# Patient Record
Sex: Male | Born: 1995 | Race: White | Hispanic: No | Marital: Single | Smoking: Never smoker
Health system: Southern US, Community
[De-identification: ages and names within clinical notes are randomized; demographics above are authoritative.]

## PROBLEM LIST (undated history)

## (undated) DIAGNOSIS — J45909 Unspecified asthma, uncomplicated: Secondary | ICD-10-CM

## (undated) HISTORY — DX: Unspecified asthma, uncomplicated: J45.909

---
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
## 2015-09-03 DIAGNOSIS — J3081 Allergic rhinitis due to animal (cat) (dog) hair and dander: Secondary | ICD-10-CM | POA: Diagnosis not present

## 2015-09-03 DIAGNOSIS — J301 Allergic rhinitis due to pollen: Secondary | ICD-10-CM | POA: Diagnosis not present

## 2015-09-12 DIAGNOSIS — J301 Allergic rhinitis due to pollen: Secondary | ICD-10-CM | POA: Diagnosis not present

## 2015-09-19 DIAGNOSIS — J301 Allergic rhinitis due to pollen: Secondary | ICD-10-CM | POA: Diagnosis not present

## 2015-09-19 DIAGNOSIS — J3081 Allergic rhinitis due to animal (cat) (dog) hair and dander: Secondary | ICD-10-CM | POA: Diagnosis not present

## 2015-09-24 DIAGNOSIS — J3081 Allergic rhinitis due to animal (cat) (dog) hair and dander: Secondary | ICD-10-CM | POA: Diagnosis not present

## 2015-10-18 DIAGNOSIS — R51 Headache: Secondary | ICD-10-CM | POA: Diagnosis not present

## 2015-10-18 DIAGNOSIS — J029 Acute pharyngitis, unspecified: Secondary | ICD-10-CM | POA: Diagnosis not present

## 2015-10-24 DIAGNOSIS — J301 Allergic rhinitis due to pollen: Secondary | ICD-10-CM | POA: Diagnosis not present

## 2015-10-24 DIAGNOSIS — J3081 Allergic rhinitis due to animal (cat) (dog) hair and dander: Secondary | ICD-10-CM | POA: Diagnosis not present

## 2015-10-30 DIAGNOSIS — J301 Allergic rhinitis due to pollen: Secondary | ICD-10-CM | POA: Diagnosis not present

## 2015-10-30 DIAGNOSIS — J3081 Allergic rhinitis due to animal (cat) (dog) hair and dander: Secondary | ICD-10-CM | POA: Diagnosis not present

## 2015-11-02 DIAGNOSIS — J3081 Allergic rhinitis due to animal (cat) (dog) hair and dander: Secondary | ICD-10-CM | POA: Diagnosis not present

## 2015-11-02 DIAGNOSIS — J301 Allergic rhinitis due to pollen: Secondary | ICD-10-CM | POA: Diagnosis not present

## 2015-11-02 DIAGNOSIS — L209 Atopic dermatitis, unspecified: Secondary | ICD-10-CM | POA: Diagnosis not present

## 2015-11-08 DIAGNOSIS — J301 Allergic rhinitis due to pollen: Secondary | ICD-10-CM | POA: Diagnosis not present

## 2015-11-08 DIAGNOSIS — J3081 Allergic rhinitis due to animal (cat) (dog) hair and dander: Secondary | ICD-10-CM | POA: Diagnosis not present

## 2015-11-23 DIAGNOSIS — G43009 Migraine without aura, not intractable, without status migrainosus: Secondary | ICD-10-CM | POA: Diagnosis not present

## 2015-11-28 DIAGNOSIS — J3081 Allergic rhinitis due to animal (cat) (dog) hair and dander: Secondary | ICD-10-CM | POA: Diagnosis not present

## 2015-11-28 DIAGNOSIS — J301 Allergic rhinitis due to pollen: Secondary | ICD-10-CM | POA: Diagnosis not present

## 2015-12-12 DIAGNOSIS — J301 Allergic rhinitis due to pollen: Secondary | ICD-10-CM | POA: Diagnosis not present

## 2015-12-12 DIAGNOSIS — J3081 Allergic rhinitis due to animal (cat) (dog) hair and dander: Secondary | ICD-10-CM | POA: Diagnosis not present

## 2016-04-10 DIAGNOSIS — Z23 Encounter for immunization: Secondary | ICD-10-CM | POA: Diagnosis not present

## 2016-04-25 ENCOUNTER — Emergency Department (HOSPITAL_COMMUNITY)
Admission: EM | Admit: 2016-04-25 | Discharge: 2016-04-26 | Disposition: A | Discharge status: Home / Self Care | Payer: BLUE CROSS/BLUE SHIELD | Attending: Emergency Medicine | Admitting: Emergency Medicine

## 2016-04-25 ENCOUNTER — Encounter (HOSPITAL_COMMUNITY): Payer: Self-pay | Admitting: Emergency Medicine

## 2016-04-25 DIAGNOSIS — T7840XA Allergy, unspecified, initial encounter: Secondary | ICD-10-CM

## 2016-04-25 NOTE — ED Triage Notes (Addendum)
Around 9 patient started itching but did not think about it and went to bed. Patient woke up with wheezing, dizziness, and severe itching in hands. Patient started to feel like hands where swelling. Patient took one benadryl and then another twenty minutes later. The only symptoms patient has now is reddened palms.

## 2016-04-26 MED ORDER — FAMOTIDINE 20 MG PO TABS
20.0000 mg | ORAL_TABLET | Freq: Once | ORAL | Status: AC
  Administered 2016-04-26: 20 mg via ORAL
  Filled 2016-04-26: qty 1

## 2016-04-26 MED ORDER — EPINEPHRINE 0.3 MG/0.3ML IJ SOAJ: 0.3000 mg | Freq: Once | INTRAMUSCULAR | 0 refills | Status: AC

## 2016-04-26 MED ORDER — DEXAMETHASONE SODIUM PHOSPHATE 10 MG/ML IJ SOLN
10.0000 mg | Freq: Once | INTRAMUSCULAR | Status: AC
  Administered 2016-04-26: 10 mg via INTRAMUSCULAR

## 2016-04-26 MED ORDER — DEXAMETHASONE SODIUM PHOSPHATE 10 MG/ML IJ SOLN
10.0000 mg | Freq: Once | INTRAMUSCULAR | Status: DC
  Filled 2016-04-26: qty 1

## 2016-04-26 NOTE — Discharge Instructions (Signed)
Please continue taking the Benadryl as needed. I'm also giving a prescription for an EpiPen to use if you feel like your throat is closing up and you are having difficulty breathing. Please see your primary care doctor this week for follow-up. Return to the ED if he develops worsening symptoms or if he had to use her EpiPen.

## 2016-04-26 NOTE — ED Provider Notes (Signed)
[facility]-EMERGENCY DEPT Provider Note   CSN: [id number] Arrival date & time: 04/25/16  2301     History   Chief Complaint Chief Complaint  Patient presents with  . Allergic Reaction    HPI Michael Norman is a 20 y.o. male.  20 year old Caucasian male with a past medical history significant for allergies, exzema and migraines presents to the ED today with a possible allergic reaction. Patient states around 9:00 this evening he started itching all over. He didn't think anything about it went to bed. Patient states that he woke up with severe itching and feeling like he couldn't breathe. Patient states like his hands were swollen. Patient denies any rash. He took 2 Benadryl and his symptoms have since resolved. Patient states that he ate a bunch of peanuts pecans this evening however he does not have any anaphylaxis reaction to peanuts. States he is treated by the allergist for several allergies none of which are food allergies. Patient does not have an epi pen. He denies any complaints at this time.      History reviewed. No pertinent past medical history.  Patient Active Problem List   Diagnosis Date Noted  . Migraine without aura, without mention of intractable migraine without mention of status migrainosus 04/22/2013  . Episodic tension type headache 04/22/2013    Past Surgical History:  Procedure Laterality Date  . WISDOM TOOTH EXTRACTION  Aug 2015       Home Medications    Prior to Admission medications   Medication Sig Start Date End Date Taking? Authorizing Provider  doxycycline (VIBRA-TABS) 100 MG tablet  02/16/14   Historical Provider, MD  eletriptan (RELPAX) 40 MG tablet Take 1 tablet at onset of migraine along with Ibuoprofen or Aleve 02/17/14   Elveria Rising, NP  loratadine (CLARITIN) 10 MG tablet Take 10 mg by mouth daily.    Historical Provider, MD  ondansetron (ZOFRAN-ODT) 4 MG disintegrating tablet Place 1 tablet under the tongue at the onset of nausea     Historical Provider, MD  ranitidine (ZANTAC) 150 MG tablet Take 150 mg by mouth 2 (two) times daily. Takes 1 tablet daily    Historical Provider, MD  SF 5000 PLUS 1.1 % CREA dental cream  01/19/14   Historical Provider, MD    Family History History reviewed. No pertinent family history.  Social History Social History  Substance Use Topics  . Smoking status: Never Smoker  . Smokeless tobacco: Never Used  . Alcohol use No     Allergies   Zyrtec [cetirizine]   Review of Systems Review of Systems  Constitutional: Negative for chills and fever.  HENT: Negative for congestion, ear pain, rhinorrhea and sore throat.   Eyes: Negative for pain and discharge.  Respiratory: Negative for cough, shortness of breath, wheezing and stridor.   Cardiovascular: Negative for chest pain and palpitations.  Gastrointestinal: Negative for abdominal pain, diarrhea, nausea and vomiting.  Genitourinary: Negative for flank pain, frequency, hematuria and urgency.  Musculoskeletal: Negative for myalgias and neck pain.  Neurological: Negative for dizziness, syncope, weakness, light-headedness, numbness and headaches.  All other systems reviewed and are negative.    Physical Exam Updated Vital Signs BP 130/76 (BP Location: Left Arm)   Pulse 70   Temp 97.9 F (36.6 C) (Oral)   Resp 20   Ht 6\' 1"  (1.854 m)   Wt 86.2 kg   SpO2 100%   BMI 25.07 kg/m   Physical Exam  Constitutional: He is oriented to person, place, and  time. He appears well-developed and well-nourished. No distress.  Patient is talking in complete sentences and managing his secretions and maintaining his airway.  HENT:  Head: Normocephalic and atraumatic.  Right Ear: Tympanic membrane, external ear and ear canal normal.  Left Ear: Tympanic membrane, external ear and ear canal normal.  Nose: Nose normal.  Mouth/Throat: Uvula is midline, oropharynx is clear and moist and mucous membranes are normal.  Eyes: Pupils are equal, round,  and reactive to light. Right eye exhibits no discharge. Left eye exhibits no discharge. No scleral icterus.  Neck: Normal range of motion. Neck supple. No thyromegaly present.  Cardiovascular: Normal rate, regular rhythm, normal heart sounds and intact distal pulses.  Exam reveals no gallop and no friction rub.   No murmur heard. Pulmonary/Chest: Effort normal and breath sounds normal. No accessory muscle usage. No tachypnea. No respiratory distress. He has no decreased breath sounds. He has no wheezes. He has no rhonchi. He has no rales.  Abdominal: Soft. Bowel sounds are normal. He exhibits no distension. There is no tenderness. There is no rebound and no guarding.  Musculoskeletal: Normal range of motion.  Lymphadenopathy:    He has no cervical adenopathy.  Neurological: He is alert and oriented to person, place, and time.  Skin: Skin is warm and dry. Capillary refill takes less than 2 seconds. No rash noted. No pallor.  No rash noted. Patient is not itching at this time.  Nursing note and vitals reviewed.    ED Treatments / Results  Labs (all labs ordered are listed, but only abnormal results are displayed) Labs Reviewed - No data to display  EKG  EKG Interpretation None       Radiology No results found.  Procedures Procedures (including critical care time)  Medications Ordered in ED Medications  famotidine (PEPCID) tablet 20 mg (20 mg Oral Given 04/26/16 0026)  dexamethasone (DECADRON) injection 10 mg (10 mg Intramuscular Given 04/26/16 0026)     Initial Impression / Assessment and Plan / ED Course  I have reviewed the triage vital signs and the nursing notes.  Pertinent labs & imaging results that were available during my care of the patient were reviewed by me and considered in my medical decision making (see chart for details).  Clinical Course   Patient presents to ED with possible allergic reaction. He exhibits no signs of a reaction this time. Patient states  that his pruritus has resolved. No signs of rash or hives noted. Patient is not hypoxic or tachypneic. He is managing his secretions and managing his airway. The patient took Benadryl at home. He was given Pepcid and Decadron here in the ED. Patient will be watched for 6 hours following the reaction. If he exhibits no signs of further reaction will be discharged home with follow-up with primary care doctor. Mother is at bedside is agreeable to the above plan. Patient is hemodynamically stable this time and in no acute distress. He was discharged with an EpiPen and continue to take benadryl as needed. Patient was given strict return precautions. Be given to PA Upstill for reassessment at 3 AM and likely discharge home. Patient seen and examined by Dr. Eudelia Bunchardama who is agreeable to the above plan.  Final Clinical Impressions(s) / ED Diagnoses   Final diagnoses:  Allergic reaction, initial encounter    New Prescriptions New Prescriptions   EPINEPHRINE 0.3 MG/0.3 ML IJ SOAJ INJECTION    Inject 0.3 mLs (0.3 mg total) into the muscle once.  Rise MuKenneth T Beyla Loney, PA-C 04/26/16 0206    Rise MuKenneth T Chelcy Bolda, PA-C 04/26/16 [phone]    Nira ConnPedro Eduardo Cardama, MD 04/26/16 [phone]

## 2016-04-26 NOTE — ED Notes (Signed)
MD at bedside. 

## 2016-04-26 NOTE — ED Provider Notes (Signed)
On reexam patient is sleeping in room in no acute distress. It has been 6 hours since the initial reaction. He is hemodynamically stable. He exhibits no signs of allergic reaction at this time. Mother is at bedside and is ready for discharge. Informed her that I'm giving him a prescription for an EpiPen. Given strict return precautions. Follow-up with primary care doctor next week. No further complaints at this time.   Rise MuKenneth T Alima Naser, PA-C 04/26/16 [phone]

## 2016-05-16 DIAGNOSIS — J301 Allergic rhinitis due to pollen: Secondary | ICD-10-CM | POA: Diagnosis not present

## 2016-05-16 DIAGNOSIS — J3089 Other allergic rhinitis: Secondary | ICD-10-CM | POA: Diagnosis not present

## 2016-05-16 DIAGNOSIS — J3081 Allergic rhinitis due to animal (cat) (dog) hair and dander: Secondary | ICD-10-CM | POA: Diagnosis not present

## 2016-05-16 DIAGNOSIS — J4530 Mild persistent asthma, uncomplicated: Secondary | ICD-10-CM | POA: Diagnosis not present

## 2016-05-23 DIAGNOSIS — T781XXA Other adverse food reactions, not elsewhere classified, initial encounter: Secondary | ICD-10-CM | POA: Diagnosis not present

## 2016-06-26 DIAGNOSIS — J301 Allergic rhinitis due to pollen: Secondary | ICD-10-CM | POA: Diagnosis not present

## 2016-06-26 DIAGNOSIS — J3081 Allergic rhinitis due to animal (cat) (dog) hair and dander: Secondary | ICD-10-CM | POA: Diagnosis not present

## 2016-06-26 DIAGNOSIS — J3089 Other allergic rhinitis: Secondary | ICD-10-CM | POA: Diagnosis not present

## 2016-07-04 DIAGNOSIS — J301 Allergic rhinitis due to pollen: Secondary | ICD-10-CM | POA: Diagnosis not present

## 2016-07-04 DIAGNOSIS — J3081 Allergic rhinitis due to animal (cat) (dog) hair and dander: Secondary | ICD-10-CM | POA: Diagnosis not present

## 2016-07-04 DIAGNOSIS — J3089 Other allergic rhinitis: Secondary | ICD-10-CM | POA: Diagnosis not present

## 2016-08-05 DIAGNOSIS — J301 Allergic rhinitis due to pollen: Secondary | ICD-10-CM | POA: Diagnosis not present

## 2016-08-05 DIAGNOSIS — J3089 Other allergic rhinitis: Secondary | ICD-10-CM | POA: Diagnosis not present

## 2016-08-05 DIAGNOSIS — J3081 Allergic rhinitis due to animal (cat) (dog) hair and dander: Secondary | ICD-10-CM | POA: Diagnosis not present

## 2016-08-31 DIAGNOSIS — R109 Unspecified abdominal pain: Secondary | ICD-10-CM | POA: Diagnosis not present

## 2016-08-31 DIAGNOSIS — K352 Acute appendicitis with generalized peritonitis: Secondary | ICD-10-CM | POA: Diagnosis not present

## 2016-08-31 DIAGNOSIS — R197 Diarrhea, unspecified: Secondary | ICD-10-CM | POA: Diagnosis not present

## 2016-08-31 DIAGNOSIS — J45909 Unspecified asthma, uncomplicated: Secondary | ICD-10-CM | POA: Diagnosis not present

## 2016-08-31 DIAGNOSIS — K3580 Unspecified acute appendicitis: Secondary | ICD-10-CM | POA: Diagnosis not present

## 2016-08-31 DIAGNOSIS — K529 Noninfective gastroenteritis and colitis, unspecified: Secondary | ICD-10-CM | POA: Diagnosis not present

## 2016-08-31 DIAGNOSIS — R1030 Lower abdominal pain, unspecified: Secondary | ICD-10-CM | POA: Diagnosis not present

## 2016-08-31 DIAGNOSIS — R10813 Right lower quadrant abdominal tenderness: Secondary | ICD-10-CM | POA: Diagnosis not present

## 2016-09-01 DIAGNOSIS — K353 Acute appendicitis with localized peritonitis: Secondary | ICD-10-CM | POA: Diagnosis not present

## 2016-09-08 DIAGNOSIS — K6811 Postprocedural retroperitoneal abscess: Secondary | ICD-10-CM | POA: Diagnosis not present

## 2016-09-08 DIAGNOSIS — J45909 Unspecified asthma, uncomplicated: Secondary | ICD-10-CM | POA: Diagnosis not present

## 2016-09-08 DIAGNOSIS — K660 Peritoneal adhesions (postprocedural) (postinfection): Secondary | ICD-10-CM | POA: Diagnosis not present

## 2016-09-08 DIAGNOSIS — T814XXA Infection following a procedure, initial encounter: Secondary | ICD-10-CM | POA: Diagnosis not present

## 2016-09-08 DIAGNOSIS — Z9089 Acquired absence of other organs: Secondary | ICD-10-CM | POA: Diagnosis not present

## 2016-09-08 DIAGNOSIS — K651 Peritoneal abscess: Secondary | ICD-10-CM | POA: Diagnosis not present

## 2016-09-08 DIAGNOSIS — R1031 Right lower quadrant pain: Secondary | ICD-10-CM | POA: Diagnosis not present

## 2016-09-08 DIAGNOSIS — Z7951 Long term (current) use of inhaled steroids: Secondary | ICD-10-CM | POA: Diagnosis not present

## 2016-09-08 DIAGNOSIS — R188 Other ascites: Secondary | ICD-10-CM | POA: Diagnosis not present

## 2016-09-08 DIAGNOSIS — R1084 Generalized abdominal pain: Secondary | ICD-10-CM | POA: Diagnosis not present

## 2016-09-08 DIAGNOSIS — G8918 Other acute postprocedural pain: Secondary | ICD-10-CM | POA: Diagnosis not present

## 2016-09-09 DIAGNOSIS — K651 Peritoneal abscess: Secondary | ICD-10-CM | POA: Diagnosis not present

## 2016-09-09 DIAGNOSIS — K660 Peritoneal adhesions (postprocedural) (postinfection): Secondary | ICD-10-CM | POA: Diagnosis not present

## 2016-09-09 DIAGNOSIS — T814XXA Infection following a procedure, initial encounter: Secondary | ICD-10-CM | POA: Diagnosis not present

## 2016-09-09 DIAGNOSIS — K6811 Postprocedural retroperitoneal abscess: Secondary | ICD-10-CM | POA: Diagnosis not present

## 2016-10-02 DIAGNOSIS — J028 Acute pharyngitis due to other specified organisms: Secondary | ICD-10-CM | POA: Diagnosis not present

## 2016-10-09 DIAGNOSIS — K219 Gastro-esophageal reflux disease without esophagitis: Secondary | ICD-10-CM | POA: Diagnosis not present

## 2016-10-09 DIAGNOSIS — J302 Other seasonal allergic rhinitis: Secondary | ICD-10-CM | POA: Diagnosis not present

## 2016-10-09 DIAGNOSIS — J329 Chronic sinusitis, unspecified: Secondary | ICD-10-CM | POA: Diagnosis not present

## 2016-10-31 DIAGNOSIS — J069 Acute upper respiratory infection, unspecified: Secondary | ICD-10-CM | POA: Diagnosis not present

## 2016-11-21 DIAGNOSIS — D225 Melanocytic nevi of trunk: Secondary | ICD-10-CM | POA: Diagnosis not present

## 2016-11-21 DIAGNOSIS — L2089 Other atopic dermatitis: Secondary | ICD-10-CM | POA: Diagnosis not present

## 2016-11-21 DIAGNOSIS — D2261 Melanocytic nevi of right upper limb, including shoulder: Secondary | ICD-10-CM | POA: Diagnosis not present

## 2016-11-21 DIAGNOSIS — D2272 Melanocytic nevi of left lower limb, including hip: Secondary | ICD-10-CM | POA: Diagnosis not present

## 2016-12-18 DIAGNOSIS — Z0000 Encounter for general adult medical examination without abnormal findings: Secondary | ICD-10-CM | POA: Diagnosis not present

## 2016-12-18 DIAGNOSIS — Z136 Encounter for screening for cardiovascular disorders: Secondary | ICD-10-CM | POA: Diagnosis not present

## 2017-04-07 DIAGNOSIS — Z23 Encounter for immunization: Secondary | ICD-10-CM | POA: Diagnosis not present

## 2017-11-03 DIAGNOSIS — Z23 Encounter for immunization: Secondary | ICD-10-CM | POA: Diagnosis not present

## 2017-11-30 DIAGNOSIS — M779 Enthesopathy, unspecified: Secondary | ICD-10-CM | POA: Diagnosis not present

## 2017-11-30 DIAGNOSIS — J302 Other seasonal allergic rhinitis: Secondary | ICD-10-CM | POA: Diagnosis not present

## 2017-12-18 DIAGNOSIS — J4530 Mild persistent asthma, uncomplicated: Secondary | ICD-10-CM | POA: Diagnosis not present

## 2017-12-18 DIAGNOSIS — J3089 Other allergic rhinitis: Secondary | ICD-10-CM | POA: Diagnosis not present

## 2017-12-18 DIAGNOSIS — J301 Allergic rhinitis due to pollen: Secondary | ICD-10-CM | POA: Diagnosis not present

## 2017-12-18 DIAGNOSIS — J3081 Allergic rhinitis due to animal (cat) (dog) hair and dander: Secondary | ICD-10-CM | POA: Diagnosis not present

## 2017-12-23 DIAGNOSIS — Z13220 Encounter for screening for lipoid disorders: Secondary | ICD-10-CM | POA: Diagnosis not present

## 2017-12-23 DIAGNOSIS — Z0000 Encounter for general adult medical examination without abnormal findings: Secondary | ICD-10-CM | POA: Diagnosis not present

## 2017-12-23 DIAGNOSIS — Z113 Encounter for screening for infections with a predominantly sexual mode of transmission: Secondary | ICD-10-CM | POA: Diagnosis not present

## 2017-12-28 DIAGNOSIS — J45909 Unspecified asthma, uncomplicated: Secondary | ICD-10-CM | POA: Diagnosis not present

## 2017-12-28 DIAGNOSIS — Z136 Encounter for screening for cardiovascular disorders: Secondary | ICD-10-CM | POA: Diagnosis not present

## 2017-12-28 DIAGNOSIS — R05 Cough: Secondary | ICD-10-CM | POA: Diagnosis not present

## 2017-12-28 DIAGNOSIS — Z6828 Body mass index (BMI) 28.0-28.9, adult: Secondary | ICD-10-CM | POA: Diagnosis not present

## 2017-12-28 DIAGNOSIS — R070 Pain in throat: Secondary | ICD-10-CM | POA: Diagnosis not present

## 2018-05-18 DIAGNOSIS — L700 Acne vulgaris: Secondary | ICD-10-CM | POA: Diagnosis not present

## 2018-05-18 DIAGNOSIS — D2262 Melanocytic nevi of left upper limb, including shoulder: Secondary | ICD-10-CM | POA: Diagnosis not present

## 2018-05-18 DIAGNOSIS — D225 Melanocytic nevi of trunk: Secondary | ICD-10-CM | POA: Diagnosis not present

## 2018-05-18 DIAGNOSIS — D2261 Melanocytic nevi of right upper limb, including shoulder: Secondary | ICD-10-CM | POA: Diagnosis not present

## 2018-05-20 DIAGNOSIS — J3089 Other allergic rhinitis: Secondary | ICD-10-CM | POA: Diagnosis not present

## 2018-05-20 DIAGNOSIS — J301 Allergic rhinitis due to pollen: Secondary | ICD-10-CM | POA: Diagnosis not present

## 2018-05-20 DIAGNOSIS — J3081 Allergic rhinitis due to animal (cat) (dog) hair and dander: Secondary | ICD-10-CM | POA: Diagnosis not present

## 2018-05-20 DIAGNOSIS — J4530 Mild persistent asthma, uncomplicated: Secondary | ICD-10-CM | POA: Diagnosis not present

## 2018-05-25 DIAGNOSIS — H1045 Other chronic allergic conjunctivitis: Secondary | ICD-10-CM | POA: Diagnosis not present

## 2018-08-19 DIAGNOSIS — R509 Fever, unspecified: Secondary | ICD-10-CM | POA: Diagnosis not present

## 2018-08-19 DIAGNOSIS — T753XXA Motion sickness, initial encounter: Secondary | ICD-10-CM | POA: Diagnosis not present

## 2018-08-19 DIAGNOSIS — J029 Acute pharyngitis, unspecified: Secondary | ICD-10-CM | POA: Diagnosis not present

## 2018-08-19 DIAGNOSIS — R0602 Shortness of breath: Secondary | ICD-10-CM | POA: Diagnosis not present

## 2018-09-02 DIAGNOSIS — K219 Gastro-esophageal reflux disease without esophagitis: Secondary | ICD-10-CM | POA: Diagnosis not present

## 2018-09-21 DIAGNOSIS — F419 Anxiety disorder, unspecified: Secondary | ICD-10-CM | POA: Diagnosis not present

## 2018-09-21 DIAGNOSIS — J45909 Unspecified asthma, uncomplicated: Secondary | ICD-10-CM | POA: Diagnosis not present

## 2018-09-21 DIAGNOSIS — K210 Gastro-esophageal reflux disease with esophagitis: Secondary | ICD-10-CM | POA: Diagnosis not present

## 2018-09-27 DIAGNOSIS — K219 Gastro-esophageal reflux disease without esophagitis: Secondary | ICD-10-CM | POA: Diagnosis not present

## 2018-10-08 DIAGNOSIS — K219 Gastro-esophageal reflux disease without esophagitis: Secondary | ICD-10-CM | POA: Diagnosis not present

## 2018-11-12 DIAGNOSIS — M549 Dorsalgia, unspecified: Secondary | ICD-10-CM | POA: Diagnosis not present

## 2018-12-07 DIAGNOSIS — K219 Gastro-esophageal reflux disease without esophagitis: Secondary | ICD-10-CM | POA: Diagnosis not present

## 2018-12-22 DIAGNOSIS — J301 Allergic rhinitis due to pollen: Secondary | ICD-10-CM | POA: Diagnosis not present

## 2018-12-22 DIAGNOSIS — J3089 Other allergic rhinitis: Secondary | ICD-10-CM | POA: Diagnosis not present

## 2018-12-22 DIAGNOSIS — J4540 Moderate persistent asthma, uncomplicated: Secondary | ICD-10-CM | POA: Diagnosis not present

## 2018-12-22 DIAGNOSIS — J3081 Allergic rhinitis due to animal (cat) (dog) hair and dander: Secondary | ICD-10-CM | POA: Diagnosis not present

## 2019-01-06 DIAGNOSIS — K210 Gastro-esophageal reflux disease with esophagitis: Secondary | ICD-10-CM | POA: Diagnosis not present

## 2019-01-06 DIAGNOSIS — R1030 Lower abdominal pain, unspecified: Secondary | ICD-10-CM | POA: Diagnosis not present

## 2019-01-20 DIAGNOSIS — Z1159 Encounter for screening for other viral diseases: Secondary | ICD-10-CM | POA: Diagnosis not present

## 2019-01-25 DIAGNOSIS — K210 Gastro-esophageal reflux disease with esophagitis: Secondary | ICD-10-CM | POA: Diagnosis not present

## 2019-01-25 DIAGNOSIS — K228 Other specified diseases of esophagus: Secondary | ICD-10-CM | POA: Diagnosis not present

## 2019-01-25 DIAGNOSIS — R1013 Epigastric pain: Secondary | ICD-10-CM | POA: Diagnosis not present

## 2019-01-25 DIAGNOSIS — K2930 Chronic superficial gastritis without bleeding: Secondary | ICD-10-CM | POA: Diagnosis not present

## 2019-01-31 DIAGNOSIS — Z13220 Encounter for screening for lipoid disorders: Secondary | ICD-10-CM | POA: Diagnosis not present

## 2019-01-31 DIAGNOSIS — Z0000 Encounter for general adult medical examination without abnormal findings: Secondary | ICD-10-CM | POA: Diagnosis not present

## 2019-02-09 DIAGNOSIS — R799 Abnormal finding of blood chemistry, unspecified: Secondary | ICD-10-CM | POA: Diagnosis not present

## 2019-02-17 DIAGNOSIS — J4540 Moderate persistent asthma, uncomplicated: Secondary | ICD-10-CM | POA: Diagnosis not present

## 2019-02-17 DIAGNOSIS — J301 Allergic rhinitis due to pollen: Secondary | ICD-10-CM | POA: Diagnosis not present

## 2019-02-17 DIAGNOSIS — J3081 Allergic rhinitis due to animal (cat) (dog) hair and dander: Secondary | ICD-10-CM | POA: Diagnosis not present

## 2019-02-17 DIAGNOSIS — J3089 Other allergic rhinitis: Secondary | ICD-10-CM | POA: Diagnosis not present

## 2019-02-18 ENCOUNTER — Other Ambulatory Visit: Payer: Self-pay | Admitting: Surgery

## 2019-02-18 ENCOUNTER — Ambulatory Visit
Admission: RE | Admit: 2019-02-18 | Discharge: 2019-02-18 | Disposition: A | Discharge status: Home / Self Care | Payer: BLUE CROSS/BLUE SHIELD | Source: Ambulatory Visit | Attending: Surgery | Admitting: Surgery

## 2019-02-18 DIAGNOSIS — R0602 Shortness of breath: Secondary | ICD-10-CM

## 2019-02-18 DIAGNOSIS — R1030 Lower abdominal pain, unspecified: Secondary | ICD-10-CM

## 2019-02-22 DIAGNOSIS — K210 Gastro-esophageal reflux disease with esophagitis: Secondary | ICD-10-CM | POA: Diagnosis not present

## 2019-02-23 DIAGNOSIS — J301 Allergic rhinitis due to pollen: Secondary | ICD-10-CM | POA: Diagnosis not present

## 2019-02-23 DIAGNOSIS — J3081 Allergic rhinitis due to animal (cat) (dog) hair and dander: Secondary | ICD-10-CM | POA: Diagnosis not present

## 2019-02-24 DIAGNOSIS — J3089 Other allergic rhinitis: Secondary | ICD-10-CM | POA: Diagnosis not present

## 2019-02-25 ENCOUNTER — Other Ambulatory Visit: Payer: Self-pay | Admitting: Surgery

## 2019-02-25 DIAGNOSIS — R109 Unspecified abdominal pain: Secondary | ICD-10-CM

## 2019-03-04 ENCOUNTER — Ambulatory Visit
Admission: RE | Admit: 2019-03-04 | Discharge: 2019-03-04 | Disposition: A | Discharge status: Home / Self Care | Payer: BLUE CROSS/BLUE SHIELD | Source: Ambulatory Visit | Attending: Surgery | Admitting: Surgery

## 2019-03-04 DIAGNOSIS — R109 Unspecified abdominal pain: Secondary | ICD-10-CM | POA: Diagnosis not present

## 2019-03-04 MED ORDER — IOPAMIDOL (ISOVUE-300) INJECTION 61%
100.0000 mL | Freq: Once | INTRAVENOUS | Status: AC | PRN
  Administered 2019-03-04: 10:00:00 100 mL via INTRAVENOUS

## 2019-03-05 DIAGNOSIS — Z23 Encounter for immunization: Secondary | ICD-10-CM | POA: Diagnosis not present

## 2019-03-09 DIAGNOSIS — J3089 Other allergic rhinitis: Secondary | ICD-10-CM | POA: Diagnosis not present

## 2019-03-09 DIAGNOSIS — J301 Allergic rhinitis due to pollen: Secondary | ICD-10-CM | POA: Diagnosis not present

## 2019-03-09 DIAGNOSIS — J3081 Allergic rhinitis due to animal (cat) (dog) hair and dander: Secondary | ICD-10-CM | POA: Diagnosis not present

## 2019-03-11 DIAGNOSIS — J301 Allergic rhinitis due to pollen: Secondary | ICD-10-CM | POA: Diagnosis not present

## 2019-03-11 DIAGNOSIS — J3089 Other allergic rhinitis: Secondary | ICD-10-CM | POA: Diagnosis not present

## 2019-03-11 DIAGNOSIS — J3081 Allergic rhinitis due to animal (cat) (dog) hair and dander: Secondary | ICD-10-CM | POA: Diagnosis not present

## 2019-03-15 DIAGNOSIS — J301 Allergic rhinitis due to pollen: Secondary | ICD-10-CM | POA: Diagnosis not present

## 2019-03-15 DIAGNOSIS — J3089 Other allergic rhinitis: Secondary | ICD-10-CM | POA: Diagnosis not present

## 2019-03-15 DIAGNOSIS — J3081 Allergic rhinitis due to animal (cat) (dog) hair and dander: Secondary | ICD-10-CM | POA: Diagnosis not present

## 2019-03-18 DIAGNOSIS — J3081 Allergic rhinitis due to animal (cat) (dog) hair and dander: Secondary | ICD-10-CM | POA: Diagnosis not present

## 2019-03-18 DIAGNOSIS — J3089 Other allergic rhinitis: Secondary | ICD-10-CM | POA: Diagnosis not present

## 2019-03-18 DIAGNOSIS — J301 Allergic rhinitis due to pollen: Secondary | ICD-10-CM | POA: Diagnosis not present

## 2019-03-22 DIAGNOSIS — J301 Allergic rhinitis due to pollen: Secondary | ICD-10-CM | POA: Diagnosis not present

## 2019-03-22 DIAGNOSIS — J3089 Other allergic rhinitis: Secondary | ICD-10-CM | POA: Diagnosis not present

## 2019-03-22 DIAGNOSIS — J3081 Allergic rhinitis due to animal (cat) (dog) hair and dander: Secondary | ICD-10-CM | POA: Diagnosis not present

## 2019-03-25 DIAGNOSIS — J3081 Allergic rhinitis due to animal (cat) (dog) hair and dander: Secondary | ICD-10-CM | POA: Diagnosis not present

## 2019-03-25 DIAGNOSIS — J3089 Other allergic rhinitis: Secondary | ICD-10-CM | POA: Diagnosis not present

## 2019-03-25 DIAGNOSIS — J301 Allergic rhinitis due to pollen: Secondary | ICD-10-CM | POA: Diagnosis not present

## 2019-03-29 DIAGNOSIS — J3081 Allergic rhinitis due to animal (cat) (dog) hair and dander: Secondary | ICD-10-CM | POA: Diagnosis not present

## 2019-03-29 DIAGNOSIS — J3089 Other allergic rhinitis: Secondary | ICD-10-CM | POA: Diagnosis not present

## 2019-03-29 DIAGNOSIS — J301 Allergic rhinitis due to pollen: Secondary | ICD-10-CM | POA: Diagnosis not present

## 2019-03-30 DIAGNOSIS — R109 Unspecified abdominal pain: Secondary | ICD-10-CM | POA: Diagnosis not present

## 2019-03-31 DIAGNOSIS — J301 Allergic rhinitis due to pollen: Secondary | ICD-10-CM | POA: Diagnosis not present

## 2019-03-31 DIAGNOSIS — J3081 Allergic rhinitis due to animal (cat) (dog) hair and dander: Secondary | ICD-10-CM | POA: Diagnosis not present

## 2019-03-31 DIAGNOSIS — J3089 Other allergic rhinitis: Secondary | ICD-10-CM | POA: Diagnosis not present

## 2019-04-05 DIAGNOSIS — J301 Allergic rhinitis due to pollen: Secondary | ICD-10-CM | POA: Diagnosis not present

## 2019-04-05 DIAGNOSIS — J3081 Allergic rhinitis due to animal (cat) (dog) hair and dander: Secondary | ICD-10-CM | POA: Diagnosis not present

## 2019-04-05 DIAGNOSIS — J3089 Other allergic rhinitis: Secondary | ICD-10-CM | POA: Diagnosis not present

## 2019-04-08 DIAGNOSIS — J3089 Other allergic rhinitis: Secondary | ICD-10-CM | POA: Diagnosis not present

## 2019-04-08 DIAGNOSIS — J301 Allergic rhinitis due to pollen: Secondary | ICD-10-CM | POA: Diagnosis not present

## 2019-04-08 DIAGNOSIS — J3081 Allergic rhinitis due to animal (cat) (dog) hair and dander: Secondary | ICD-10-CM | POA: Diagnosis not present

## 2019-04-11 DIAGNOSIS — Z20828 Contact with and (suspected) exposure to other viral communicable diseases: Secondary | ICD-10-CM | POA: Diagnosis not present

## 2019-04-13 DIAGNOSIS — J301 Allergic rhinitis due to pollen: Secondary | ICD-10-CM | POA: Diagnosis not present

## 2019-04-13 DIAGNOSIS — K2100 Gastro-esophageal reflux disease with esophagitis, without bleeding: Secondary | ICD-10-CM | POA: Diagnosis not present

## 2019-04-13 DIAGNOSIS — J3089 Other allergic rhinitis: Secondary | ICD-10-CM | POA: Diagnosis not present

## 2019-04-13 DIAGNOSIS — J3081 Allergic rhinitis due to animal (cat) (dog) hair and dander: Secondary | ICD-10-CM | POA: Diagnosis not present

## 2019-04-14 DIAGNOSIS — K219 Gastro-esophageal reflux disease without esophagitis: Secondary | ICD-10-CM | POA: Diagnosis not present

## 2019-04-14 DIAGNOSIS — J343 Hypertrophy of nasal turbinates: Secondary | ICD-10-CM | POA: Diagnosis not present

## 2019-04-14 DIAGNOSIS — Z7289 Other problems related to lifestyle: Secondary | ICD-10-CM | POA: Diagnosis not present

## 2019-04-14 DIAGNOSIS — J358 Other chronic diseases of tonsils and adenoids: Secondary | ICD-10-CM | POA: Diagnosis not present

## 2019-04-15 ENCOUNTER — Other Ambulatory Visit (HOSPITAL_COMMUNITY): Payer: Self-pay | Admitting: Gastroenterology

## 2019-04-15 ENCOUNTER — Other Ambulatory Visit: Payer: Self-pay | Admitting: Gastroenterology

## 2019-04-15 DIAGNOSIS — R1010 Upper abdominal pain, unspecified: Secondary | ICD-10-CM

## 2019-04-15 DIAGNOSIS — K219 Gastro-esophageal reflux disease without esophagitis: Secondary | ICD-10-CM

## 2019-04-20 DIAGNOSIS — J3081 Allergic rhinitis due to animal (cat) (dog) hair and dander: Secondary | ICD-10-CM | POA: Diagnosis not present

## 2019-04-20 DIAGNOSIS — J3089 Other allergic rhinitis: Secondary | ICD-10-CM | POA: Diagnosis not present

## 2019-04-20 DIAGNOSIS — J301 Allergic rhinitis due to pollen: Secondary | ICD-10-CM | POA: Diagnosis not present

## 2019-04-26 DIAGNOSIS — J3089 Other allergic rhinitis: Secondary | ICD-10-CM | POA: Diagnosis not present

## 2019-04-26 DIAGNOSIS — J301 Allergic rhinitis due to pollen: Secondary | ICD-10-CM | POA: Diagnosis not present

## 2019-04-26 DIAGNOSIS — J3081 Allergic rhinitis due to animal (cat) (dog) hair and dander: Secondary | ICD-10-CM | POA: Diagnosis not present

## 2019-04-29 ENCOUNTER — Ambulatory Visit (HOSPITAL_COMMUNITY)
Admission: RE | Admit: 2019-04-29 | Discharge: 2019-04-29 | Disposition: A | Discharge status: Home / Self Care | Payer: BC Managed Care – PPO | Source: Ambulatory Visit | Attending: Gastroenterology | Admitting: Gastroenterology

## 2019-04-29 ENCOUNTER — Other Ambulatory Visit: Payer: Self-pay

## 2019-04-29 DIAGNOSIS — K219 Gastro-esophageal reflux disease without esophagitis: Secondary | ICD-10-CM | POA: Diagnosis not present

## 2019-04-29 DIAGNOSIS — R1010 Upper abdominal pain, unspecified: Secondary | ICD-10-CM | POA: Diagnosis not present

## 2019-04-29 DIAGNOSIS — R109 Unspecified abdominal pain: Secondary | ICD-10-CM | POA: Diagnosis not present

## 2019-04-29 MED ORDER — TECHNETIUM TC 99M SULFUR COLLOID
2.0000 | Freq: Once | INTRAVENOUS | Status: AC | PRN
  Administered 2019-04-29: 2 via INTRAVENOUS

## 2019-05-12 DIAGNOSIS — J3081 Allergic rhinitis due to animal (cat) (dog) hair and dander: Secondary | ICD-10-CM | POA: Diagnosis not present

## 2019-05-12 DIAGNOSIS — J301 Allergic rhinitis due to pollen: Secondary | ICD-10-CM | POA: Diagnosis not present

## 2019-05-12 DIAGNOSIS — J3089 Other allergic rhinitis: Secondary | ICD-10-CM | POA: Diagnosis not present

## 2019-05-17 DIAGNOSIS — J3081 Allergic rhinitis due to animal (cat) (dog) hair and dander: Secondary | ICD-10-CM | POA: Diagnosis not present

## 2019-05-17 DIAGNOSIS — J3089 Other allergic rhinitis: Secondary | ICD-10-CM | POA: Diagnosis not present

## 2019-05-17 DIAGNOSIS — J301 Allergic rhinitis due to pollen: Secondary | ICD-10-CM | POA: Diagnosis not present

## 2019-05-17 DIAGNOSIS — J4540 Moderate persistent asthma, uncomplicated: Secondary | ICD-10-CM | POA: Diagnosis not present

## 2019-05-19 ENCOUNTER — Telehealth: Payer: Self-pay | Admitting: Internal Medicine

## 2019-05-19 NOTE — Telephone Encounter (Signed)
Michael Norman  He is grandson of Mr Loletha Grayer. Please make new consult appt to see me in Jan 2021 for cough  Thanks    SIGNATURE    Dr. Brand Males, M.D., F.C.C.P,  Pulmonary and Critical Care Medicine Staff Physician, [place] Director - Interstitial Lung Disease  Program  Pulmonary [place] [facility] [place], [place], [address]  Pager: [phone], If no answer or between  15:00h - 7:00h: call [phone] Telephone: [phone]  12:14 PM 05/19/2019

## 2019-05-19 NOTE — Telephone Encounter (Signed)
Called and spoke with pt seeing if I could get him scheduled for an appt with MR and pt stated that was fine. Pt had an appt scheduled with AO so I cancelled that appt. Pt has been scheduled for consult with MR 06/22/19 but stated to pt he could check back to see if we had had any cancellations and he verbalized understanding. Nothing further needed.

## 2019-05-20 DIAGNOSIS — J301 Allergic rhinitis due to pollen: Secondary | ICD-10-CM | POA: Diagnosis not present

## 2019-05-20 DIAGNOSIS — J3081 Allergic rhinitis due to animal (cat) (dog) hair and dander: Secondary | ICD-10-CM | POA: Diagnosis not present

## 2019-05-20 DIAGNOSIS — J3089 Other allergic rhinitis: Secondary | ICD-10-CM | POA: Diagnosis not present

## 2019-05-24 ENCOUNTER — Institutional Professional Consult (permissible substitution): Payer: BC Managed Care – PPO | Admitting: Pulmonary Disease

## 2019-05-25 DIAGNOSIS — J3081 Allergic rhinitis due to animal (cat) (dog) hair and dander: Secondary | ICD-10-CM | POA: Diagnosis not present

## 2019-05-25 DIAGNOSIS — J301 Allergic rhinitis due to pollen: Secondary | ICD-10-CM | POA: Diagnosis not present

## 2019-05-25 DIAGNOSIS — J3089 Other allergic rhinitis: Secondary | ICD-10-CM | POA: Diagnosis not present

## 2019-06-07 DIAGNOSIS — J3081 Allergic rhinitis due to animal (cat) (dog) hair and dander: Secondary | ICD-10-CM | POA: Diagnosis not present

## 2019-06-07 DIAGNOSIS — J3089 Other allergic rhinitis: Secondary | ICD-10-CM | POA: Diagnosis not present

## 2019-06-07 DIAGNOSIS — J301 Allergic rhinitis due to pollen: Secondary | ICD-10-CM | POA: Diagnosis not present

## 2019-06-13 DIAGNOSIS — J301 Allergic rhinitis due to pollen: Secondary | ICD-10-CM | POA: Diagnosis not present

## 2019-06-13 DIAGNOSIS — J3089 Other allergic rhinitis: Secondary | ICD-10-CM | POA: Diagnosis not present

## 2019-06-13 DIAGNOSIS — J3081 Allergic rhinitis due to animal (cat) (dog) hair and dander: Secondary | ICD-10-CM | POA: Diagnosis not present

## 2019-06-15 DIAGNOSIS — J301 Allergic rhinitis due to pollen: Secondary | ICD-10-CM | POA: Diagnosis not present

## 2019-06-15 DIAGNOSIS — J3081 Allergic rhinitis due to animal (cat) (dog) hair and dander: Secondary | ICD-10-CM | POA: Diagnosis not present

## 2019-06-15 DIAGNOSIS — J3089 Other allergic rhinitis: Secondary | ICD-10-CM | POA: Diagnosis not present

## 2019-06-17 DIAGNOSIS — D3102 Benign neoplasm of left conjunctiva: Secondary | ICD-10-CM | POA: Diagnosis not present

## 2019-06-21 DIAGNOSIS — J301 Allergic rhinitis due to pollen: Secondary | ICD-10-CM | POA: Diagnosis not present

## 2019-06-21 DIAGNOSIS — J3089 Other allergic rhinitis: Secondary | ICD-10-CM | POA: Diagnosis not present

## 2019-06-21 DIAGNOSIS — J3081 Allergic rhinitis due to animal (cat) (dog) hair and dander: Secondary | ICD-10-CM | POA: Diagnosis not present

## 2019-06-22 ENCOUNTER — Ambulatory Visit (INDEPENDENT_AMBULATORY_CARE_PROVIDER_SITE_OTHER): Payer: BC Managed Care – PPO | Admitting: Internal Medicine

## 2019-06-22 ENCOUNTER — Encounter: Payer: Self-pay | Admitting: Internal Medicine

## 2019-06-22 ENCOUNTER — Other Ambulatory Visit: Payer: Self-pay

## 2019-06-22 VITALS — BP 118/60 | HR 74 | Temp 97.4°F | Ht 73.0 in | Wt 215.0 lb

## 2019-06-22 DIAGNOSIS — R0600 Dyspnea, unspecified: Secondary | ICD-10-CM

## 2019-06-22 DIAGNOSIS — Z789 Other specified health status: Secondary | ICD-10-CM

## 2019-06-22 DIAGNOSIS — Z9109 Other allergy status, other than to drugs and biological substances: Secondary | ICD-10-CM | POA: Diagnosis not present

## 2019-06-22 DIAGNOSIS — R0789 Other chest pain: Secondary | ICD-10-CM | POA: Diagnosis not present

## 2019-06-22 LAB — CBC WITH DIFFERENTIAL/PLATELET
Basophils Absolute: 0 10*3/uL (ref 0.0–0.1)
Basophils Relative: 0.5 % (ref 0.0–3.0)
Eosinophils Absolute: 0.1 10*3/uL (ref 0.0–0.7)
Eosinophils Relative: 1.5 % (ref 0.0–5.0)
HCT: 39.4 % (ref 39.0–52.0)
Hemoglobin: 13.5 g/dL (ref 13.0–17.0)
Lymphocytes Relative: 38.9 % (ref 12.0–46.0)
Lymphs Abs: 2.1 10*3/uL (ref 0.7–4.0)
MCHC: 34.3 g/dL (ref 30.0–36.0)
MCV: 89.6 fl (ref 78.0–100.0)
Monocytes Absolute: 0.4 10*3/uL (ref 0.1–1.0)
Monocytes Relative: 6.5 % (ref 3.0–12.0)
Neutro Abs: 2.9 10*3/uL (ref 1.4–7.7)
Neutrophils Relative %: 52.6 % (ref 43.0–77.0)
Platelets: 241 10*3/uL (ref 150.0–400.0)
RBC: 4.39 Mil/uL (ref 4.22–5.81)
RDW: 13.2 % (ref 11.5–15.5)
WBC: 5.4 10*3/uL (ref 4.0–10.5)

## 2019-06-22 NOTE — Progress Notes (Signed)
Subjective:    Patient ID: Michael Norman, male    DOB: [birth year], 24 y.o.   MRN: [id number]  PCP Seward Carol, MD   HPI  IOV 06/22/2019  Chief Complaint  Patient presents with  . Pulmonary Consult    SOB, unable to catch a full breath    [name]  -presents with his mom Michael Norman.  He is a grandson of my other patient Michael Norman, Sr.  Patient is a very good historian.  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  He lives with his parents and brothers.  He is a second of 4 children.  He tells me that approximately since July 2019 he has been unable to get a deep breath otherwise feels like chest tightness.  The initial onset was when he visited the beach house here in [place] and before he went to [place] in August 2019.  He describes this is random episodes lasting from few hours to few days.  But never lasting minutes always lasting hours to days.  He said when he was living in [place] between August 2019 and March 2020 it was more severe.  In both instances work stress or emotional stress and acid reflux symptoms bring this on and when work stress of personal stress is less the symptoms are less.  Nevertheless this do not illuminated.  Tums tablets does help but PPI has not helped.  Sometimes MDI helps partially especially albuterol.  But has been on Qvar and Symbicort and allergy shots and these have not really helped.  There is no wheezing or proximal nocturnal dyspnea orthopnea.  He gets his symptoms mainly when he is at rest.  Working out riding a bicycle does not provoke these.  He has a history of acid reflux since the last 4 to 5 years.  He has a lot of burping and indigestion.  He had an endoscopy in September 2020 by Dr. Cristina Norman that apparently showed inflammation.  He took PPI and even H2 blockade but this has not helped his chest tightness.  He has a long history of allergies  to "all of nature".  He has seen Dr. Fredderick Norman for this and follows up with him.  Few years ago he was on allergy shots and this helped with his allergy symptoms which include runny nose and itchy eyes.  These allergy symptoms are unrelated to his need to take a deep breath.  For the last 3 months he has been back on allergy shots without any help in the presenting complaint for this visit.  He was also on Qvar for many years but in the last 6 months has been on Symbicort again without any relief for the current symptoms.  He does use Nettie pot few times a week.  In 2018 he did have a ruptured appendicitis and had abscess and is status post surgery. \    He did have a follow-up CT of the abdomen in October 2020.  I personally visualized his lung images and the clear.  His chest x-ray from September 2020 is clear  He is also worried about potential TB exposure when he was in [place].  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 Michael Norman thinks he has had brief exposure to the person.  He does not have any active symptoms of tuberculosis at  this point.    has a past medical history of Asthma.   reports that he has never smoked. He has never used smokeless tobacco.  Past Surgical History:  Procedure Laterality Date  . WISDOM TOOTH EXTRACTION  Aug 2015    Allergies  Allergen Reactions  . Zyrtec [Cetirizine]      There is no immunization history on file for this patient.  History reviewed. No pertinent family history.   Current Outpatient Medications:  .  eletriptan (RELPAX) 40 MG tablet, Take 1 tablet at onset of migraine along with Ibuoprofen or Aleve, Disp: 10 tablet, Rfl: 5 .  loratadine (CLARITIN) 10 MG tablet, Take 10 mg by mouth daily., Disp: , Rfl:  .  ondansetron (ZOFRAN-ODT) 4 MG disintegrating tablet, Place 1 tablet under the tongue at the onset of nausea, Disp: , Rfl:  .  ranitidine (ZANTAC) 150 MG tablet, Take 150 mg by  mouth 2 (two) times daily. Takes 1 tablet daily, Disp: , Rfl:  .  albuterol (VENTOLIN HFA) 108 (90 Base) MCG/ACT inhaler, , Disp: , Rfl:  .  SYMBICORT 160-4.5 MCG/ACT inhaler, , Disp: , Rfl:    Review of Systems  Constitutional: Negative for fever and unexpected weight change.  HENT: Negative for congestion, dental problem, ear pain, nosebleeds, postnasal drip, rhinorrhea, sinus pressure, sneezing, sore throat and trouble swallowing.   Eyes: Negative for redness and itching.  Respiratory: Positive for cough and shortness of breath. Negative for chest tightness and wheezing.   Cardiovascular: Negative for palpitations and leg swelling.  Gastrointestinal: Positive for abdominal pain. Negative for nausea and vomiting.  Genitourinary: Negative for dysuria.  Musculoskeletal: Negative for joint swelling.  Skin: Negative for rash.  Neurological: Negative for headaches.  Hematological: Does not bruise/bleed easily.  Psychiatric/Behavioral: Negative for dysphoric mood. The patient is not nervous/anxious.        Objective:   Physical Exam  Vitals:   06/22/19 1049  BP: 118/60  Pulse: 74  Temp: (!) 97.4 F (36.3 C)  TempSrc: Temporal  SpO2: 97%  Weight: 215 lb (97.5 kg)  Height: 6\' 1"  (1.854 m)    Estimated body mass index is 28.37 kg/m as calculated from the following:   Height as of this encounter: 6\' 1"  (1.854 m).   Weight as of this encounter: 215 lb (97.5 kg).    General Appearance:  Looks well. One time took a deep breath Head:  Normocephalic, without obvious abnormality, atraumatic Eyes:  PERRL - yes, conjunctiva/corneas - clear     Ears:  Normal external ear canals, both ears Nose:  G tube - no Throat:  ETT TUBE - no , OG tube - no. Clear. No post nasal drip Neck:  Supple,  No enlargement/tenderness/nodules Lungs: Clear to auscultation bilaterally,  Heart:  S1 and S2 normal, no murmur,  Abdomen:  Soft, no masses, no organomegaly. Small periumblicial scar Genitalia /  Rectal:  Not done Extremities:  Extremities- intact Skin:  ntact in exposed areas . Sacral area - not examined Neurologic:  Sedation - none -> RASS - +1 . Moves all 4s - yes. CAM-ICU - neg . Orientation - x3+         Assessment & Plan:     ICD-10-CM   1. Dyspnea, unspecified type  R06.00   2. Chest tightness  R07.89   3. History of environmental allergies  Z91.09   4. H/O foreign travel  Z78.9    Unclear nature of the dyspnea and chest tightness.  It is possible  that he still has some airway inflammation that might only be apparent with the help of an IgE or exhaled  nitric oxide testing.  Therefore we will try to solicit this along with spirometry with bronchodilator response.  Given his clear chest x-ray and CT abdomen with the base of the lung being clear I do not foresee a reason to get a high-resolution CT chest at this point.  Given the possible TB exposure we will get a QuantiFERON gold  We will review these results.  It is possible that at the end of this we do not have an answer for his dyspnea and chest tightness.  At which time we will proceed with the pulmonary stress test which might be helpful.   Patient Instructions     ICD-10-CM   1. Dyspnea, unspecified type  R06.00   2. Chest tightness  R07.89   3. History of environmental allergies  Z91.09   4. H/O foreign travel  Z78.9     Do cbc with diff,  Do blood IgE Do blood quantiferon gold Get FeNO test and spirometry with bronchodilator test  - next 1-2 weeks (ideally < 1 weeks) Hold off on CT chest for now Sign release of records from Dr Madie Reno  Followup  15 min telephone visit to discuss results   - if results normal, then proceed with pulmonary stress with test on bike with EIB challebge      SIGNATURE    Dr. Kalman Shan, M.D., F.C.C.P,  Pulmonary and Critical Care Medicine Staff Physician, [facility] Director - Interstitial Lung Disease  Program  Pulmonary Fibrosis  [facility] Network at [facility] [place], [place], [address]  Pager: [phone], If no answer or between  15:00h - 7:00h: call [phone] Telephone: [phone]  11:32 AM 06/22/2019

## 2019-06-22 NOTE — Patient Instructions (Signed)
ICD-10-CM   1. Dyspnea, unspecified type  R06.00   2. Chest tightness  R07.89   3. History of environmental allergies  Z91.09   4. H/O foreign travel  Z78.9     Do cbc with diff,  Do blood IgE Do blood quantiferon gold Get FeNO test and spirometry with bronchodilator test  - next 1-2 weeks (ideally < 1 weeks) Hold off on CT chest for now Sign release of records from Dr Madie Reno  Followup  15 min telephone visit to discuss results   - if results normal, then proceed with pulmonary stress with test on bike with EIB challebge

## 2019-06-24 LAB — QUANTIFERON-TB GOLD PLUS
Mitogen-NIL: >10 IU/mL
NIL: 0.02 IU/mL
QuantiFERON-TB Gold Plus: NEGATIVE
TB1-NIL: 0 IU/mL
TB2-NIL: 0.01 IU/mL

## 2019-06-24 LAB — IGE: IgE (Immunoglobulin E), Serum: 25 kU/L (ref <=114)

## 2019-06-30 DIAGNOSIS — J3081 Allergic rhinitis due to animal (cat) (dog) hair and dander: Secondary | ICD-10-CM | POA: Diagnosis not present

## 2019-06-30 DIAGNOSIS — J3089 Other allergic rhinitis: Secondary | ICD-10-CM | POA: Diagnosis not present

## 2019-06-30 DIAGNOSIS — J301 Allergic rhinitis due to pollen: Secondary | ICD-10-CM | POA: Diagnosis not present

## 2019-07-04 DIAGNOSIS — J3089 Other allergic rhinitis: Secondary | ICD-10-CM | POA: Diagnosis not present

## 2019-07-04 DIAGNOSIS — J301 Allergic rhinitis due to pollen: Secondary | ICD-10-CM | POA: Diagnosis not present

## 2019-07-04 DIAGNOSIS — J3081 Allergic rhinitis due to animal (cat) (dog) hair and dander: Secondary | ICD-10-CM | POA: Diagnosis not present

## 2019-07-06 DIAGNOSIS — J3081 Allergic rhinitis due to animal (cat) (dog) hair and dander: Secondary | ICD-10-CM | POA: Diagnosis not present

## 2019-07-06 DIAGNOSIS — J3089 Other allergic rhinitis: Secondary | ICD-10-CM | POA: Diagnosis not present

## 2019-07-06 DIAGNOSIS — J301 Allergic rhinitis due to pollen: Secondary | ICD-10-CM | POA: Diagnosis not present

## 2019-07-12 DIAGNOSIS — J301 Allergic rhinitis due to pollen: Secondary | ICD-10-CM | POA: Diagnosis not present

## 2019-07-12 DIAGNOSIS — J3089 Other allergic rhinitis: Secondary | ICD-10-CM | POA: Diagnosis not present

## 2019-07-12 DIAGNOSIS — J3081 Allergic rhinitis due to animal (cat) (dog) hair and dander: Secondary | ICD-10-CM | POA: Diagnosis not present

## 2019-07-14 DIAGNOSIS — J301 Allergic rhinitis due to pollen: Secondary | ICD-10-CM | POA: Diagnosis not present

## 2019-07-14 DIAGNOSIS — J3089 Other allergic rhinitis: Secondary | ICD-10-CM | POA: Diagnosis not present

## 2019-07-14 DIAGNOSIS — J3081 Allergic rhinitis due to animal (cat) (dog) hair and dander: Secondary | ICD-10-CM | POA: Diagnosis not present

## 2019-07-19 ENCOUNTER — Telehealth: Payer: Self-pay | Admitting: Internal Medicine

## 2019-07-19 DIAGNOSIS — J3081 Allergic rhinitis due to animal (cat) (dog) hair and dander: Secondary | ICD-10-CM | POA: Diagnosis not present

## 2019-07-19 DIAGNOSIS — J3089 Other allergic rhinitis: Secondary | ICD-10-CM | POA: Diagnosis not present

## 2019-07-19 DIAGNOSIS — J301 Allergic rhinitis due to pollen: Secondary | ICD-10-CM | POA: Diagnosis not present

## 2019-07-19 MED ORDER — SPIRIVA RESPIMAT 1.25 MCG/ACT IN AERS: 2.0000 | INHALATION_SPRAY | Freq: Every day | RESPIRATORY_TRACT | 0 refills | Status: DC

## 2019-07-19 NOTE — Telephone Encounter (Signed)
Noted.  Will close encounter.  

## 2019-07-19 NOTE — Addendum Note (Signed)
Addended by: Wyvonne Lenz on: 07/19/2019 10:44 AM   Modules accepted: Orders

## 2019-07-19 NOTE — Telephone Encounter (Signed)
I will give results to his mom 07/19/2019\ because she is here with his grandfather. Sorry for delay.   Blood cbc with eosinophils, and igE - both markers for allergy - are normal  Quantiferon Gold is negative - TB exposure test  Please let Lovette Cliche know  tHanks    SIGNATURE    Dr. Kalman Shan, M.D., F.C.C.P,  Pulmonary and Critical Care Medicine Staff Physician, [facility] Director - Interstitial Lung Disease  Program  Pulmonary Fibrosis [facility] at [facility] [place], [place], [address]  Pager: [phone], If no answer or between  15:00h - 7:00h: call [phone] Telephone: [phone]  10:07 AM 07/19/2019

## 2019-07-19 NOTE — Addendum Note (Signed)
Addended by: Wyvonne Lenz on: 07/19/2019 10:47 AM   Modules accepted: Orders

## 2019-07-20 ENCOUNTER — Ambulatory Visit: Payer: BC Managed Care – PPO | Attending: Internal Medicine

## 2019-07-20 DIAGNOSIS — Z20822 Contact with and (suspected) exposure to covid-19: Secondary | ICD-10-CM

## 2019-07-21 LAB — NOVEL CORONAVIRUS, NAA: SARS-CoV-2, NAA: NOT DETECTED

## 2019-07-22 ENCOUNTER — Telehealth: Payer: Self-pay | Admitting: Internal Medicine

## 2019-07-22 NOTE — Telephone Encounter (Signed)
Reviewed records Michael Norman from Dr. Madie Reno.  Patient had a skin test on February 17, 2019.  It appears he is positive for several things.  He is 2+4 red Sater, red mulberry, American mix, dust mite Walton, Lennon, [employer]. He is 3+4 E. on Trimix, [employer], suite Vernal, dust mite .He is 4+ on several things including [place] grass Johnson grass orchard grass fescue Timothy grass [place] blue-red top.  He had similar results event on June 01, 2014.  His medical history based on this includes allergic rhinitis due to pollen, allergic rhinitis due to animal cat and dog hair and dander, other allergic rhinitis and moderate persistent asthma uncomplicated and acid reflux and atopic dermatitis  He has had spirometry.  June 01, 2014 results are normal FEV1 and FVC.  Similar normal results December 15, 2014, August 09, 2015, May 16, 2016; December 18, 2017; May 20, 2018  Several notes reviewed and sent to scanned folder.  The most recent one is Dr. Brooks Sailors May 17, 2019 for a diagnosis of history of asthma allergic rhinitis acid reflux and atopic dermatitis have been given.  It was noted that after skin testing in September 2020 immunotherapy was started in October 2020.  This was the visit that triggered the referral to myself Dr. Marchelle Gearing

## 2019-07-26 DIAGNOSIS — J3081 Allergic rhinitis due to animal (cat) (dog) hair and dander: Secondary | ICD-10-CM | POA: Diagnosis not present

## 2019-07-26 DIAGNOSIS — J301 Allergic rhinitis due to pollen: Secondary | ICD-10-CM | POA: Diagnosis not present

## 2019-07-26 DIAGNOSIS — J3089 Other allergic rhinitis: Secondary | ICD-10-CM | POA: Diagnosis not present

## 2019-08-04 DIAGNOSIS — J3081 Allergic rhinitis due to animal (cat) (dog) hair and dander: Secondary | ICD-10-CM | POA: Diagnosis not present

## 2019-08-04 DIAGNOSIS — J3089 Other allergic rhinitis: Secondary | ICD-10-CM | POA: Diagnosis not present

## 2019-08-04 DIAGNOSIS — J301 Allergic rhinitis due to pollen: Secondary | ICD-10-CM | POA: Diagnosis not present

## 2019-08-08 ENCOUNTER — Other Ambulatory Visit (HOSPITAL_COMMUNITY)
Admission: RE | Admit: 2019-08-08 | Discharge: 2019-08-08 | Disposition: A | Discharge status: Home / Self Care | Payer: BC Managed Care – PPO | Source: Ambulatory Visit | Attending: Internal Medicine | Admitting: Internal Medicine

## 2019-08-08 DIAGNOSIS — Z01812 Encounter for preprocedural laboratory examination: Secondary | ICD-10-CM | POA: Insufficient documentation

## 2019-08-08 DIAGNOSIS — J3081 Allergic rhinitis due to animal (cat) (dog) hair and dander: Secondary | ICD-10-CM | POA: Diagnosis not present

## 2019-08-08 DIAGNOSIS — Z20822 Contact with and (suspected) exposure to covid-19: Secondary | ICD-10-CM | POA: Insufficient documentation

## 2019-08-08 DIAGNOSIS — J301 Allergic rhinitis due to pollen: Secondary | ICD-10-CM | POA: Diagnosis not present

## 2019-08-08 LAB — SARS CORONAVIRUS 2 (TAT 6-24 HRS): SARS Coronavirus 2: NEGATIVE

## 2019-08-09 DIAGNOSIS — J301 Allergic rhinitis due to pollen: Secondary | ICD-10-CM | POA: Diagnosis not present

## 2019-08-09 DIAGNOSIS — J3081 Allergic rhinitis due to animal (cat) (dog) hair and dander: Secondary | ICD-10-CM | POA: Diagnosis not present

## 2019-08-09 DIAGNOSIS — J3089 Other allergic rhinitis: Secondary | ICD-10-CM | POA: Diagnosis not present

## 2019-08-11 ENCOUNTER — Ambulatory Visit (INDEPENDENT_AMBULATORY_CARE_PROVIDER_SITE_OTHER): Payer: BC Managed Care – PPO | Admitting: Internal Medicine

## 2019-08-11 ENCOUNTER — Other Ambulatory Visit: Payer: Self-pay | Admitting: *Deleted

## 2019-08-11 ENCOUNTER — Other Ambulatory Visit: Payer: Self-pay

## 2019-08-11 DIAGNOSIS — R0600 Dyspnea, unspecified: Secondary | ICD-10-CM | POA: Diagnosis not present

## 2019-08-11 DIAGNOSIS — Z9109 Other allergy status, other than to drugs and biological substances: Secondary | ICD-10-CM

## 2019-08-11 LAB — PULMONARY FUNCTION TEST
FEF 25-75 Post: 6.6 L/sec
FEF 25-75 Pre: 5.88 L/sec
FEF2575-%Change-Post: 12 %
FEF2575-%Pred-Post: 129 %
FEF2575-%Pred-Pre: 115 %
FEV1-%Change-Post: 7 %
FEV1-%Pred-Post: 119 %
FEV1-%Pred-Pre: 111 %
FEV1-Post: 6.01 L
FEV1-Pre: 5.57 L
FEV1FVC-%Change-Post: 4 %
FEV1FVC-%Pred-Pre: 99 %
FEV6-%Change-Post: 3 %
FEV6-%Pred-Post: 114 %
FEV6-%Pred-Pre: 110 %
FEV6-Post: 6.93 L
FEV6-Pre: 6.69 L
FEV6FVC-%Pred-Post: 100 %
FEV6FVC-%Pred-Pre: 100 %
FVC-%Change-Post: 3 %
FVC-%Pred-Post: 113 %
FVC-%Pred-Pre: 110 %
FVC-Post: 6.93 L
FVC-Pre: 6.7 L
Post FEV1/FVC ratio: 87 %
Post FEV6/FVC ratio: 100 %
Pre FEV1/FVC ratio: 83 %
Pre FEV6/FVC Ratio: 100 %

## 2019-08-11 LAB — NITRIC OXIDE: 11800305105: 9

## 2019-08-11 NOTE — Progress Notes (Signed)
FENO/ pre and post spiro performed today.

## 2019-08-11 NOTE — Progress Notes (Signed)
@Patient  ID: Michael Norman, male    DOB: [birth year], 24 y.o.   MRN: [id number]  Chief Complaint  Patient presents with  . Follow-up    rev FENO / PFT results    Referring provider: Seward Carol, MD  HPI:  24 year old never smoker initially consulted with our practice on 06/22/2019 with Dr. Chase Caller.  Pulmonary consult was for shortness of breath.  Past medical history: Migraine Smoking history: Never smoker Maintenance: Symbicort 160 Patient of Dr. Chase Caller  08/12/2019  - Visit   24 year old male never smoker presenting to our.  Completing a televisit with our office today to review Feno as well as pulmonary function testing.  He completed these yesterday.  He has a long history of allergies and has seen Dr. [name] for this and continues to follow-up with him.  He has been on allergy shots in the past and that has helped with runny nose and itchy eyes symptoms.  CT of abdomen October/2020 shows basilar lungs are clear.  At last office visit in January/2021 was on clear what would be the major cause of patient's dyspnea.  A spirometry was ordered, lab work was ordered as well as a FENO. Those results are listed below:   08/11/2019-pulmonary function test-FVC 6.7 (110% predicted), postbronchodilator ratio 87, postbronchodilator FEV1 6.01 (119% predicted), no bronchodilator response,  08/11/2019 - FENO - 9  She reports that he will be seen [facility] next week for further evaluation of his persistent GERD symptoms.  He has previously been followed by [facility].  He is status post endoscopy last year which showed mild inflammation.  He has had trials of PPIs as well as H2 inhibitors without much success.  He continues to report daily persistent acid reflux symptoms.  His breathing symptoms are directly related to his GERD symptoms and usually occur right after it.   Tests:   06/22/2019-QuantiFERON gold-negative 06/22/2019-IgE-25 06/22/2019-CBC with differential-eosinophils relative  1.5, eosinophils absolute 0.1  07/20/2019-SARS-CoV-2-not detected 08/08/2019-SARS-CoV-2-not detected  05/20/2018-spirometry-FVC 5.93 (101% predicted), FEV1 4.06 (83% predicted), ratio 80%, felt to have normal spirometry  08/11/2019 - FENO - 9  08/11/2019-pulmonary function test-FVC 6.7 (110% predicted), postbronchodilator ratio 87, postbronchodilator FEV1 6.01 (119% predicted), no bronchodilator response,    FENO:  No results found for: NITRICOXIDE  PFT: PFT Results Latest Ref Rng & Units 08/11/2019  FVC-Pre L 6.70  FVC-Predicted Pre % 110  FVC-Post L 6.93  FVC-Predicted Post % 113  Pre FEV1/FVC % % 83  Post FEV1/FCV % % 87  FEV1-Pre L 5.57  FEV1-Predicted Pre % 111  FEV1-Post L 6.01    WALK:  No flowsheet data found.  Imaging: No results found.  Lab Results:  CBC    Component Value Date/Time   WBC 5.4 06/22/2019 1143   RBC 4.39 06/22/2019 1143   HGB 13.5 06/22/2019 1143   HCT 39.4 06/22/2019 1143   PLT 241.0 06/22/2019 1143   MCV 89.6 06/22/2019 1143   MCHC 34.3 06/22/2019 1143   RDW 13.2 06/22/2019 1143   LYMPHSABS 2.1 06/22/2019 1143   MONOABS 0.4 06/22/2019 1143   EOSABS 0.1 06/22/2019 1143   BASOSABS 0.0 06/22/2019 1143    BMET No results found for: NA, K, CL, CO2, GLUCOSE, BUN, CREATININE, CALCIUM, GFRNONAA, GFRAA  BNP No results found for: BNP  ProBNP No results found for: PROBNP  Specialty Problems      Pulmonary Problems   Dyspnea      Allergies  Allergen Reactions  . Zyrtec [Cetirizine]  There is no immunization history on file for this patient.  Past Medical History:  Diagnosis Date  . Asthma     Tobacco History: Social History   Tobacco Use  Smoking Status Never Smoker  Smokeless Tobacco Never Used   Counseling given: Yes   Continue to not smoke  Outpatient Encounter Medications as of 08/12/2019  Medication Sig  . albuterol (VENTOLIN HFA) 108 (90 Base) MCG/ACT inhaler   . azelastine (ASTELIN) 0.1 % nasal spray    . eletriptan (RELPAX) 40 MG tablet Take 1 tablet at onset of migraine along with Ibuoprofen or Aleve  . fluticasone (FLONASE) 50 MCG/ACT nasal spray fluticasone propionate 50 mcg/actuation nasal spray,suspension  . loratadine (CLARITIN) 10 MG tablet Take 10 mg by mouth daily.  . SYMBICORT 160-4.5 MCG/ACT inhaler   . [DISCONTINUED] ondansetron (ZOFRAN-ODT) 4 MG disintegrating tablet Place 1 tablet under the tongue at the onset of nausea  . [DISCONTINUED] ranitidine (ZANTAC) 150 MG tablet Take 150 mg by mouth 2 (two) times daily. Takes 1 tablet daily   No facility-administered encounter medications on file as of 08/12/2019.     Review of Systems  Review of Systems  Constitutional: Negative for activity change, chills, fatigue, fever and unexpected weight change.  HENT: Negative for postnasal drip, rhinorrhea, sinus pressure, sinus pain and sore throat.   Eyes: Negative.   Respiratory: Negative for cough, shortness of breath and wheezing.   Cardiovascular: Negative for chest pain and palpitations.  Gastrointestinal: Negative for diarrhea, nausea and vomiting.       Genella Rife - persistent symptoms   Endocrine: Negative.   Genitourinary: Negative.   Musculoskeletal: Negative.   Skin: Negative.   Neurological: Negative for dizziness and headaches.  Psychiatric/Behavioral: Negative.  Negative for dysphoric mood. The patient is not nervous/anxious.   All other systems reviewed and are negative.    Physical Exam  BP 116/82 (BP Location: Left Arm, Patient Position: Sitting, Cuff Size: Normal)   Pulse 85   Temp 98.4 F (36.9 C) (Temporal)   Ht 6\' 1"  (1.854 m)   Wt 216 lb (98 kg)   SpO2 100% Comment: on RA  BMI 28.50 kg/m   Wt Readings from Last 5 Encounters:  08/12/19 216 lb (98 kg)  06/22/19 215 lb (97.5 kg)  04/25/16 190 lb (86.2 kg) (87 %, Z= 1.14)*  02/17/14 181 lb 9.6 oz (82.4 kg) (88 %, Z= 1.17)*  04/22/13 165 lb (74.8 kg) (80 %, Z= 0.86)*   * Growth percentiles are based on  CDC (Boys, 2-20 Years) data.    BMI Readings from Last 5 Encounters:  08/12/19 28.50 kg/m  06/22/19 28.37 kg/m  04/25/16 25.07 kg/m (73 %, Z= 0.60)*  02/17/14 24.29 kg/m (78 %, Z= 0.77)*  04/22/13 23.01 kg/m (72 %, Z= 0.59)*   * Growth percentiles are based on CDC (Boys, 2-20 Years) data.     Physical Exam Vitals and nursing note reviewed.  Constitutional:      General: He is not in acute distress.    Appearance: Normal appearance. He is normal weight.  HENT:     Head: Normocephalic and atraumatic.     Right Ear: Hearing and external ear normal.     Left Ear: Hearing and external ear normal.     Nose: Nose normal. No mucosal edema or rhinorrhea.     Right Turbinates: Not enlarged.     Left Turbinates: Not enlarged.     Mouth/Throat:     Mouth: Mucous membranes are dry.  Pharynx: Oropharynx is clear. No oropharyngeal exudate.  Eyes:     Pupils: Pupils are equal, round, and reactive to light.  Cardiovascular:     Rate and Rhythm: Normal rate and regular rhythm.     Pulses: Normal pulses.     Heart sounds: Normal heart sounds. No murmur.  Pulmonary:     Effort: Pulmonary effort is normal.     Breath sounds: Normal breath sounds. No decreased breath sounds, wheezing or rales.  Musculoskeletal:     Cervical back: Normal range of motion.  Lymphadenopathy:     Cervical: No cervical adenopathy.  Skin:    General: Skin is warm and dry.     Capillary Refill: Capillary refill takes less than 2 seconds.     Findings: No erythema or rash.  Neurological:     General: No focal deficit present.     Mental Status: He is alert and oriented to person, place, and time.     Motor: No weakness.     Coordination: Coordination normal.     Gait: Gait is intact. Gait normal.  Psychiatric:        Mood and Affect: Mood normal.        Behavior: Behavior normal. Behavior is cooperative.        Thought Content: Thought content normal.        Judgment: Judgment normal.        Assessment & Plan:   Dyspnea Recent PFTs reviewed with patient today These are stable Worsening shortness of breath seems to have been associated with GERD symptoms Lab work reviewed with patient Nitric oxide test reviewed with patient  Plan: For right now we will continue Symbicort 160 Agree with [facility] eval for further evaluation of persistent reflux symptoms   GERD (gastroesophageal reflux disease) Ongoing symptoms of acid reflux daily Patient is worked with [facility] Trials of PPIs as well as H2 blockers have been unsuccessful Endoscopy in 2020 showed minor inflammation Patient has upcoming appointment with [facility] to further evaluate Shortness of breath and respiratory symptoms are directly linked to GERD symptoms  Plan: Complete [facility] appointment Notify our office after their work-up    Return in about 6 weeks (around 09/23/2019), or if symptoms worsen or fail to improve, for Follow up with Dr. Dalbert Mayotte.   Coral Ceo, NP 08/12/2019   This appointment required 25 minutes of patient care (this includes precharting, chart review, review of results, face-to-face care, etc.).

## 2019-08-12 ENCOUNTER — Ambulatory Visit (INDEPENDENT_AMBULATORY_CARE_PROVIDER_SITE_OTHER): Payer: BC Managed Care – PPO | Admitting: Pulmonary Disease

## 2019-08-12 ENCOUNTER — Encounter: Payer: Self-pay | Admitting: Pulmonary Disease

## 2019-08-12 DIAGNOSIS — K219 Gastro-esophageal reflux disease without esophagitis: Secondary | ICD-10-CM

## 2019-08-12 DIAGNOSIS — R0600 Dyspnea, unspecified: Secondary | ICD-10-CM | POA: Diagnosis not present

## 2019-08-12 DIAGNOSIS — R0609 Other forms of dyspnea: Secondary | ICD-10-CM

## 2019-08-12 NOTE — Assessment & Plan Note (Signed)
Ongoing symptoms of acid reflux daily Patient is worked with [facility] Trials of PPIs as well as H2 blockers have been unsuccessful Endoscopy in 2020 showed minor inflammation Patient has upcoming appointment with [facility] to further evaluate Shortness of breath and respiratory symptoms are directly linked to GERD symptoms  Plan: Complete [facility] appointment Notify our office after their work-up

## 2019-08-12 NOTE — Assessment & Plan Note (Signed)
Recent PFTs reviewed with patient today These are stable Worsening shortness of breath seems to have been associated with GERD symptoms Lab work reviewed with patient Nitric oxide test reviewed with patient  Plan: For right now we will continue Symbicort 160 Agree with [facility] eval for further evaluation of persistent reflux symptoms

## 2019-08-12 NOTE — Patient Instructions (Addendum)
You were seen today by Coral Ceo, NP  for:   It was nice meeting you today.  Please give [place] a call if you have additional questions or concerns regarding your work-up or anything we discussed today.  For right now we will have you continue your Symbicort 160.  I completely agree with you to be best to complete the referral with [facility] to see what will be the next best steps moving forward given your persistent reflux symptoms.  Please contact us if you have completed that referral so we can evaluate their note.  Take care and stay safe,  Columbia Pandey  1. 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 Gastroesophageal reflux disease, unspecified whether esophagitis present  Complete [facility] appointment/consult as scheduled  Follow Up:    Return in about 6 weeks (around 09/23/2019), or if symptoms worsen or fail to improve, for Follow up with Dr. Dalbert Mayotte.   Please do your part to reduce the spread of COVID-19:      Reduce your risk of any infection  and COVID19 by using the similar precautions used for avoiding the common cold or flu:  Marland Kitchen Wash your hands often with soap and warm water for at least 20 seconds.  If soap and water are not readily available, use an alcohol-based hand sanitizer with at least 60% alcohol.  . If coughing or sneezing, cover your mouth and nose by coughing or sneezing into the elbow areas of your shirt or coat, into a tissue or into your sleeve (not your hands). Drinda Butts A MASK when in public  . Avoid shaking hands with others and consider head nods or verbal greetings only. . Avoid touching your eyes, nose, or mouth with unwashed hands.  . Avoid close contact with people who are sick. . Avoid places or events with  large numbers of people in one location, like concerts or sporting events. . If you have some symptoms but not all symptoms, continue to monitor at home and seek medical attention if your symptoms worsen. . If you are having a medical emergency, call 911.   ADDITIONAL HEALTHCARE OPTIONS FOR PATIENTS  [facility] / e-Visit: [email or web address]         [facility]: [phone]  [facility]: [phone]                   [facility]: [phone]     It is flu season:   >>> Best ways to protect herself from the flu: Receive the yearly flu vaccine, practice good hand hygiene washing with soap and also using hand sanitizer when available, eat a nutritious meals, get adequate rest, hydrate appropriately   Please contact the office if your symptoms worsen or you have concerns that you are not improving.   Thank you for choosing [facility] for your healthcare, and for allowing [place] to partner with you on your healthcare journey. I am thankful to be able to provide care to you today.   Elisha Headland FNP-C

## 2019-08-17 DIAGNOSIS — Z6828 Body mass index (BMI) 28.0-28.9, adult: Secondary | ICD-10-CM | POA: Diagnosis not present

## 2019-08-17 DIAGNOSIS — K2100 Gastro-esophageal reflux disease with esophagitis, without bleeding: Secondary | ICD-10-CM | POA: Diagnosis not present

## 2019-08-23 DIAGNOSIS — J3089 Other allergic rhinitis: Secondary | ICD-10-CM | POA: Diagnosis not present

## 2019-08-23 DIAGNOSIS — J3081 Allergic rhinitis due to animal (cat) (dog) hair and dander: Secondary | ICD-10-CM | POA: Diagnosis not present

## 2019-08-23 DIAGNOSIS — J301 Allergic rhinitis due to pollen: Secondary | ICD-10-CM | POA: Diagnosis not present

## 2019-09-01 DIAGNOSIS — J301 Allergic rhinitis due to pollen: Secondary | ICD-10-CM | POA: Diagnosis not present

## 2019-09-01 DIAGNOSIS — J3089 Other allergic rhinitis: Secondary | ICD-10-CM | POA: Diagnosis not present

## 2019-09-01 DIAGNOSIS — J3081 Allergic rhinitis due to animal (cat) (dog) hair and dander: Secondary | ICD-10-CM | POA: Diagnosis not present

## 2019-09-12 ENCOUNTER — Ambulatory Visit: Payer: BC Managed Care – PPO | Attending: Internal Medicine

## 2019-09-12 DIAGNOSIS — Z20822 Contact with and (suspected) exposure to covid-19: Secondary | ICD-10-CM

## 2019-09-14 LAB — NOVEL CORONAVIRUS, NAA: SARS-CoV-2, NAA: NOT DETECTED

## 2019-09-14 LAB — SARS-COV-2, NAA 2 DAY TAT

## 2019-09-15 DIAGNOSIS — K219 Gastro-esophageal reflux disease without esophagitis: Secondary | ICD-10-CM | POA: Diagnosis not present

## 2019-09-15 DIAGNOSIS — R0602 Shortness of breath: Secondary | ICD-10-CM | POA: Diagnosis not present

## 2019-09-15 DIAGNOSIS — R070 Pain in throat: Secondary | ICD-10-CM | POA: Diagnosis not present

## 2019-09-15 DIAGNOSIS — R05 Cough: Secondary | ICD-10-CM | POA: Diagnosis not present

## 2019-09-15 DIAGNOSIS — R079 Chest pain, unspecified: Secondary | ICD-10-CM | POA: Diagnosis not present

## 2019-09-15 DIAGNOSIS — R142 Eructation: Secondary | ICD-10-CM | POA: Diagnosis not present

## 2019-09-16 DIAGNOSIS — J301 Allergic rhinitis due to pollen: Secondary | ICD-10-CM | POA: Diagnosis not present

## 2019-09-16 DIAGNOSIS — J4540 Moderate persistent asthma, uncomplicated: Secondary | ICD-10-CM | POA: Diagnosis not present

## 2019-09-16 DIAGNOSIS — J3081 Allergic rhinitis due to animal (cat) (dog) hair and dander: Secondary | ICD-10-CM | POA: Diagnosis not present

## 2019-09-16 DIAGNOSIS — J3089 Other allergic rhinitis: Secondary | ICD-10-CM | POA: Diagnosis not present

## 2019-09-20 DIAGNOSIS — J3089 Other allergic rhinitis: Secondary | ICD-10-CM | POA: Diagnosis not present

## 2019-09-20 DIAGNOSIS — J3081 Allergic rhinitis due to animal (cat) (dog) hair and dander: Secondary | ICD-10-CM | POA: Diagnosis not present

## 2019-09-20 DIAGNOSIS — J301 Allergic rhinitis due to pollen: Secondary | ICD-10-CM | POA: Diagnosis not present

## 2019-09-27 DIAGNOSIS — J3081 Allergic rhinitis due to animal (cat) (dog) hair and dander: Secondary | ICD-10-CM | POA: Diagnosis not present

## 2019-09-27 DIAGNOSIS — J301 Allergic rhinitis due to pollen: Secondary | ICD-10-CM | POA: Diagnosis not present

## 2019-09-27 DIAGNOSIS — J3089 Other allergic rhinitis: Secondary | ICD-10-CM | POA: Diagnosis not present

## 2019-09-28 ENCOUNTER — Ambulatory Visit (INDEPENDENT_AMBULATORY_CARE_PROVIDER_SITE_OTHER): Payer: BC Managed Care – PPO | Admitting: Internal Medicine

## 2019-09-28 ENCOUNTER — Other Ambulatory Visit: Payer: Self-pay

## 2019-09-28 ENCOUNTER — Encounter: Payer: Self-pay | Admitting: Internal Medicine

## 2019-09-28 VITALS — BP 124/70 | HR 84 | Temp 97.4°F | Ht 73.0 in | Wt 222.4 lb

## 2019-09-28 DIAGNOSIS — K219 Gastro-esophageal reflux disease without esophagitis: Secondary | ICD-10-CM | POA: Diagnosis not present

## 2019-09-28 DIAGNOSIS — R0600 Dyspnea, unspecified: Secondary | ICD-10-CM | POA: Diagnosis not present

## 2019-09-28 DIAGNOSIS — R0609 Other forms of dyspnea: Secondary | ICD-10-CM

## 2019-09-28 NOTE — Patient Instructions (Signed)
ICD-10-CM   1. Dyspnea on exertion  R06.00   2. Gastroesophageal reflux disease, unspecified whether esophagitis present  K21.9      Agre with expectant approach + life style change  Plan  - meditation   - use app like headspace or Calm. Also look up [employer] on medidation  - daily 10-30min - yoga - do once a week - diet  - use myfitnesspal for caloric limit  - follow low glycemic diet sheet though need to adust pre-workout and post-workout - exercise  - high intensity interval training and weights 2 x/week  - continue symbicort and allergy shots  - do covid vaccine if not done yet  Followup  - 6 months or sooner if needed   - if no improvement then CPST bike test +/- CT

## 2019-09-28 NOTE — Progress Notes (Signed)
Subjective:    Patient ID: Michael Norman, male    DOB: 05/10/1996, 24 y.o.   MRN: [id number]  PCP Renford Dills, MD   HPI  IOV 06/22/2019  Chief Complaint  Patient presents with  . Pulmonary Consult    SOB, unable to catch a full breath    Michael Norman  -presents with his mom Michael Norman.  He is a grandson of my other patient Michael Poll, Sr.  Patient is a very good historian.  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  He lives with his parents and brothers.  He is a second of 4 children.  He tells me that approximately since July 2019 he has been unable to get a deep breath otherwise feels like chest tightness.  The initial onset was when he visited the beach house here in [place] and before he went to [place] in August 2019.  He describes this is random episodes lasting from few hours to few days.  But never lasting minutes always lasting hours to days.  He said when he was living in [place] between August 2019 and March 2020 it was more severe.  In both instances work stress or emotional stress and acid reflux symptoms bring this on and when work stress of personal stress is less the symptoms are less.  Nevertheless this do not illuminated.  Tums tablets does help but PPI has not helped.  Sometimes MDI helps partially especially albuterol.  But has been on Qvar and Symbicort and allergy shots and these have not really helped.  There is no wheezing or proximal nocturnal dyspnea orthopnea.  He gets his symptoms mainly when he is at rest.  Working out riding a bicycle does not provoke these.  He has a history of acid reflux since the last 4 to 5 years.  He has a lot of burping and indigestion.  He had an endoscopy in September 2020 by Dr. Matthias Hughs that apparently showed inflammation.  He took PPI and even H2 blockade but this has not helped his chest tightness.  He has a long history of  allergies to "all of nature".  He has seen Dr. Madie Reno for this and follows up with him.  Few years ago he was on allergy shots and this helped with his allergy symptoms which include runny nose and itchy eyes.  These allergy symptoms are unrelated to his need to take a deep breath.  For the last 3 months he has been back on allergy shots without any help in the presenting complaint for this visit.  He was also on Qvar for many years but in the last 6 months has been on Symbicort again without any relief for the current symptoms.  He does use Nettie pot few times a week.  In 2018 he did have a ruptured appendicitis and had abscess and is status post surgery. \    He did have a follow-up CT of the abdomen in October 2020.  I personally visualized his lung images and the clear.  His chest x-ray from September 2020 is clear  He is also worried about potential TB exposure when he was in [place].  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 Duecker thinks he has had brief exposure to the person.  He does not have any active symptoms of  tuberculosis at this point.    has a past medical history of Asthma.   reports that he has never smoked. He has never used smokeless tobacco.  OV 01/18/2020  Subjective:  Patient ID: Michael Norman, male , DOB: [birth year] , age 6 y.o. , MRN: [id number] , ADDRESS: [address] [place] [address]   01/18/2020 -   Chief Complaint  Patient presents with  . Follow-up    Pt states he is about the same since visit and states he still is becoming SOB with activities and also due to indigestion.     HPI Michael Norman 23 y.o. -presents for follow-up.  He is here to review evaluation.  Overall the same since last visit.  Still has some shortness of breath with activities and also indigestion.   ROS - per HPI     has a past medical history of Asthma.   reports that he has never smoked. He has  never used smokeless tobacco.  Past Surgical History:  Procedure Laterality Date  . WISDOM TOOTH EXTRACTION  Aug 2015    Allergies  Allergen Reactions  . Zyrtec [Cetirizine]     Immunization History  Administered Date(s) Administered  . Influenza,inj,Quad PF,6+ Mos 03/03/2019  . Influenza-Unspecified 04/23/2016    No family history on file.   Current Outpatient Medications:  .  Olopatadine HCl (PATADAY OP), Apply 1 drop to eye as needed., Disp: , Rfl:  .  albuterol (VENTOLIN HFA) 108 (90 Base) MCG/ACT inhaler, , Disp: , Rfl:  .  azelastine (ASTELIN) 0.1 % nasal spray, , Disp: , Rfl:  .  eletriptan (RELPAX) 40 MG tablet, Take 1 tablet at onset of migraine along with Ibuoprofen or Aleve, Disp: 10 tablet, Rfl: 5 .  fluticasone (FLONASE) 50 MCG/ACT nasal spray, fluticasone propionate 50 mcg/actuation nasal spray,suspension, Disp: , Rfl:  .  loratadine (CLARITIN) 10 MG tablet, Take 10 mg by mouth daily., Disp: , Rfl:  .  SYMBICORT 160-4.5 MCG/ACT inhaler, , Disp: , Rfl:       Objective:   Vitals:   09/28/19 1206  BP: 124/70  Pulse: 84  Temp: (!) 97.4 F (36.3 C)  TempSrc: Temporal  SpO2: 97%  Weight: 222 lb 6.4 oz (100.9 kg)  Height: 6\' 1"  (1.854 m)    Estimated body mass index is 29.34 kg/m as calculated from the following:   Height as of this encounter: 6\' 1"  (1.854 m).   Weight as of this encounter: 222 lb 6.4 oz (100.9 kg).  @WEIGHTCHANGE @    09/28/19 1206  Weight: 222 lb 6.4 oz (100.9 kg)     Physical Exam  Pleasant male with nonfocal exam clear to auscultation bilaterally.  Normal heart sounds alert and oriented x3.  Speech normal no elevated neck nodes or JVP.  No stigmata of connective tissue disease.          Assessment:       ICD-10-CM   1. Dyspnea on exertion  R06.00   2. Gastroesophageal reflux disease, unspecified whether esophagitis present  K21.9        Plan:     Patient Instructions     ICD-10-CM   1. Dyspnea on  exertion  R06.00   2. Gastroesophageal reflux disease, unspecified whether esophagitis present  K21.9      Agre with expectant approach + life style change  Plan  - meditation   - use app like headspace or Calm. Also look up on medidation  -  daily 10-74min - yoga - do once a week - diet  - use myfitnesspal for caloric limit  - follow low glycemic diet sheet though need to adust pre-workout and post-workout - exercise  - high intensity interval training and weights 2 x/week  - continue symbicort and allergy shots  - do covid vaccine if not done yet  Followup  - 6 months or sooner if needed   - if no improvement then CPST bike test +/- CT     SIGNATURE    Dr. Brand Males, M.D., F.C.C.P,  Pulmonary and Critical Care Medicine Staff Physician, [facility] Director - Interstitial Lung Disease  Program  [facility] [place], [place], [address]  Pager: [phone], If no answer or between  15:00h - 7:00h: call [phone] Telephone: [phone]  10:34 AM 01/18/2020

## 2019-09-29 DIAGNOSIS — J301 Allergic rhinitis due to pollen: Secondary | ICD-10-CM | POA: Diagnosis not present

## 2019-09-29 DIAGNOSIS — J3089 Other allergic rhinitis: Secondary | ICD-10-CM | POA: Diagnosis not present

## 2019-09-29 DIAGNOSIS — J3081 Allergic rhinitis due to animal (cat) (dog) hair and dander: Secondary | ICD-10-CM | POA: Diagnosis not present

## 2019-10-07 DIAGNOSIS — J301 Allergic rhinitis due to pollen: Secondary | ICD-10-CM | POA: Diagnosis not present

## 2019-10-07 DIAGNOSIS — J3089 Other allergic rhinitis: Secondary | ICD-10-CM | POA: Diagnosis not present

## 2019-10-07 DIAGNOSIS — J3081 Allergic rhinitis due to animal (cat) (dog) hair and dander: Secondary | ICD-10-CM | POA: Diagnosis not present

## 2019-10-14 DIAGNOSIS — J301 Allergic rhinitis due to pollen: Secondary | ICD-10-CM | POA: Diagnosis not present

## 2019-10-14 DIAGNOSIS — J3081 Allergic rhinitis due to animal (cat) (dog) hair and dander: Secondary | ICD-10-CM | POA: Diagnosis not present

## 2019-10-14 DIAGNOSIS — J3089 Other allergic rhinitis: Secondary | ICD-10-CM | POA: Diagnosis not present

## 2019-10-18 DIAGNOSIS — J3081 Allergic rhinitis due to animal (cat) (dog) hair and dander: Secondary | ICD-10-CM | POA: Diagnosis not present

## 2019-10-18 DIAGNOSIS — J3089 Other allergic rhinitis: Secondary | ICD-10-CM | POA: Diagnosis not present

## 2019-10-18 DIAGNOSIS — J301 Allergic rhinitis due to pollen: Secondary | ICD-10-CM | POA: Diagnosis not present

## 2019-10-25 DIAGNOSIS — J3089 Other allergic rhinitis: Secondary | ICD-10-CM | POA: Diagnosis not present

## 2019-10-25 DIAGNOSIS — J301 Allergic rhinitis due to pollen: Secondary | ICD-10-CM | POA: Diagnosis not present

## 2019-10-25 DIAGNOSIS — J3081 Allergic rhinitis due to animal (cat) (dog) hair and dander: Secondary | ICD-10-CM | POA: Diagnosis not present

## 2019-11-02 DIAGNOSIS — J3089 Other allergic rhinitis: Secondary | ICD-10-CM | POA: Diagnosis not present

## 2019-11-02 DIAGNOSIS — J301 Allergic rhinitis due to pollen: Secondary | ICD-10-CM | POA: Diagnosis not present

## 2019-11-02 DIAGNOSIS — J3081 Allergic rhinitis due to animal (cat) (dog) hair and dander: Secondary | ICD-10-CM | POA: Diagnosis not present

## 2019-11-09 DIAGNOSIS — J3089 Other allergic rhinitis: Secondary | ICD-10-CM | POA: Diagnosis not present

## 2019-11-09 DIAGNOSIS — J301 Allergic rhinitis due to pollen: Secondary | ICD-10-CM | POA: Diagnosis not present

## 2019-11-29 DIAGNOSIS — J301 Allergic rhinitis due to pollen: Secondary | ICD-10-CM | POA: Diagnosis not present

## 2019-11-29 DIAGNOSIS — J3089 Other allergic rhinitis: Secondary | ICD-10-CM | POA: Diagnosis not present

## 2019-11-29 DIAGNOSIS — J3081 Allergic rhinitis due to animal (cat) (dog) hair and dander: Secondary | ICD-10-CM | POA: Diagnosis not present

## 2019-12-27 DIAGNOSIS — J301 Allergic rhinitis due to pollen: Secondary | ICD-10-CM | POA: Diagnosis not present

## 2019-12-27 DIAGNOSIS — J3081 Allergic rhinitis due to animal (cat) (dog) hair and dander: Secondary | ICD-10-CM | POA: Diagnosis not present

## 2019-12-27 DIAGNOSIS — J3089 Other allergic rhinitis: Secondary | ICD-10-CM | POA: Diagnosis not present

## 2020-01-03 DIAGNOSIS — J3081 Allergic rhinitis due to animal (cat) (dog) hair and dander: Secondary | ICD-10-CM | POA: Diagnosis not present

## 2020-01-03 DIAGNOSIS — J3089 Other allergic rhinitis: Secondary | ICD-10-CM | POA: Diagnosis not present

## 2020-01-03 DIAGNOSIS — J301 Allergic rhinitis due to pollen: Secondary | ICD-10-CM | POA: Diagnosis not present

## 2020-01-10 DIAGNOSIS — J3081 Allergic rhinitis due to animal (cat) (dog) hair and dander: Secondary | ICD-10-CM | POA: Diagnosis not present

## 2020-01-10 DIAGNOSIS — J3089 Other allergic rhinitis: Secondary | ICD-10-CM | POA: Diagnosis not present

## 2020-01-10 DIAGNOSIS — J301 Allergic rhinitis due to pollen: Secondary | ICD-10-CM | POA: Diagnosis not present

## 2020-01-17 DIAGNOSIS — Z20822 Contact with and (suspected) exposure to covid-19: Secondary | ICD-10-CM | POA: Diagnosis not present

## 2020-01-24 DIAGNOSIS — J3089 Other allergic rhinitis: Secondary | ICD-10-CM | POA: Diagnosis not present

## 2020-01-24 DIAGNOSIS — J301 Allergic rhinitis due to pollen: Secondary | ICD-10-CM | POA: Diagnosis not present

## 2020-01-24 DIAGNOSIS — J3081 Allergic rhinitis due to animal (cat) (dog) hair and dander: Secondary | ICD-10-CM | POA: Diagnosis not present

## 2020-01-31 DIAGNOSIS — J3081 Allergic rhinitis due to animal (cat) (dog) hair and dander: Secondary | ICD-10-CM | POA: Diagnosis not present

## 2020-01-31 DIAGNOSIS — J3089 Other allergic rhinitis: Secondary | ICD-10-CM | POA: Diagnosis not present

## 2020-01-31 DIAGNOSIS — J301 Allergic rhinitis due to pollen: Secondary | ICD-10-CM | POA: Diagnosis not present

## 2020-02-07 DIAGNOSIS — J3089 Other allergic rhinitis: Secondary | ICD-10-CM | POA: Diagnosis not present

## 2020-02-09 DIAGNOSIS — J3081 Allergic rhinitis due to animal (cat) (dog) hair and dander: Secondary | ICD-10-CM | POA: Diagnosis not present

## 2020-02-09 DIAGNOSIS — J301 Allergic rhinitis due to pollen: Secondary | ICD-10-CM | POA: Diagnosis not present

## 2020-02-17 DIAGNOSIS — R109 Unspecified abdominal pain: Secondary | ICD-10-CM | POA: Diagnosis not present

## 2020-02-17 DIAGNOSIS — Z0000 Encounter for general adult medical examination without abnormal findings: Secondary | ICD-10-CM | POA: Diagnosis not present

## 2020-02-17 DIAGNOSIS — Z13220 Encounter for screening for lipoid disorders: Secondary | ICD-10-CM | POA: Diagnosis not present

## 2020-02-20 DIAGNOSIS — J3089 Other allergic rhinitis: Secondary | ICD-10-CM | POA: Diagnosis not present

## 2020-02-23 DIAGNOSIS — J3081 Allergic rhinitis due to animal (cat) (dog) hair and dander: Secondary | ICD-10-CM | POA: Diagnosis not present

## 2020-02-23 DIAGNOSIS — J3089 Other allergic rhinitis: Secondary | ICD-10-CM | POA: Diagnosis not present

## 2020-02-23 DIAGNOSIS — J301 Allergic rhinitis due to pollen: Secondary | ICD-10-CM | POA: Diagnosis not present

## 2020-02-28 DIAGNOSIS — J3089 Other allergic rhinitis: Secondary | ICD-10-CM | POA: Diagnosis not present

## 2020-02-28 DIAGNOSIS — J3081 Allergic rhinitis due to animal (cat) (dog) hair and dander: Secondary | ICD-10-CM | POA: Diagnosis not present

## 2020-02-28 DIAGNOSIS — J301 Allergic rhinitis due to pollen: Secondary | ICD-10-CM | POA: Diagnosis not present

## 2020-03-13 DIAGNOSIS — J329 Chronic sinusitis, unspecified: Secondary | ICD-10-CM | POA: Diagnosis not present

## 2020-03-13 DIAGNOSIS — J302 Other seasonal allergic rhinitis: Secondary | ICD-10-CM | POA: Diagnosis not present

## 2020-03-21 IMAGING — CR DG CHEST 2V
2 series · 2 of 2 positions shown · non-contrast
Comparison: Chest x-ray dated 06/28/2012.

CLINICAL DATA: Chest pain and shortness of breath for 3-4 months.

EXAM:
CHEST - 2 VIEW

[w chest pa]
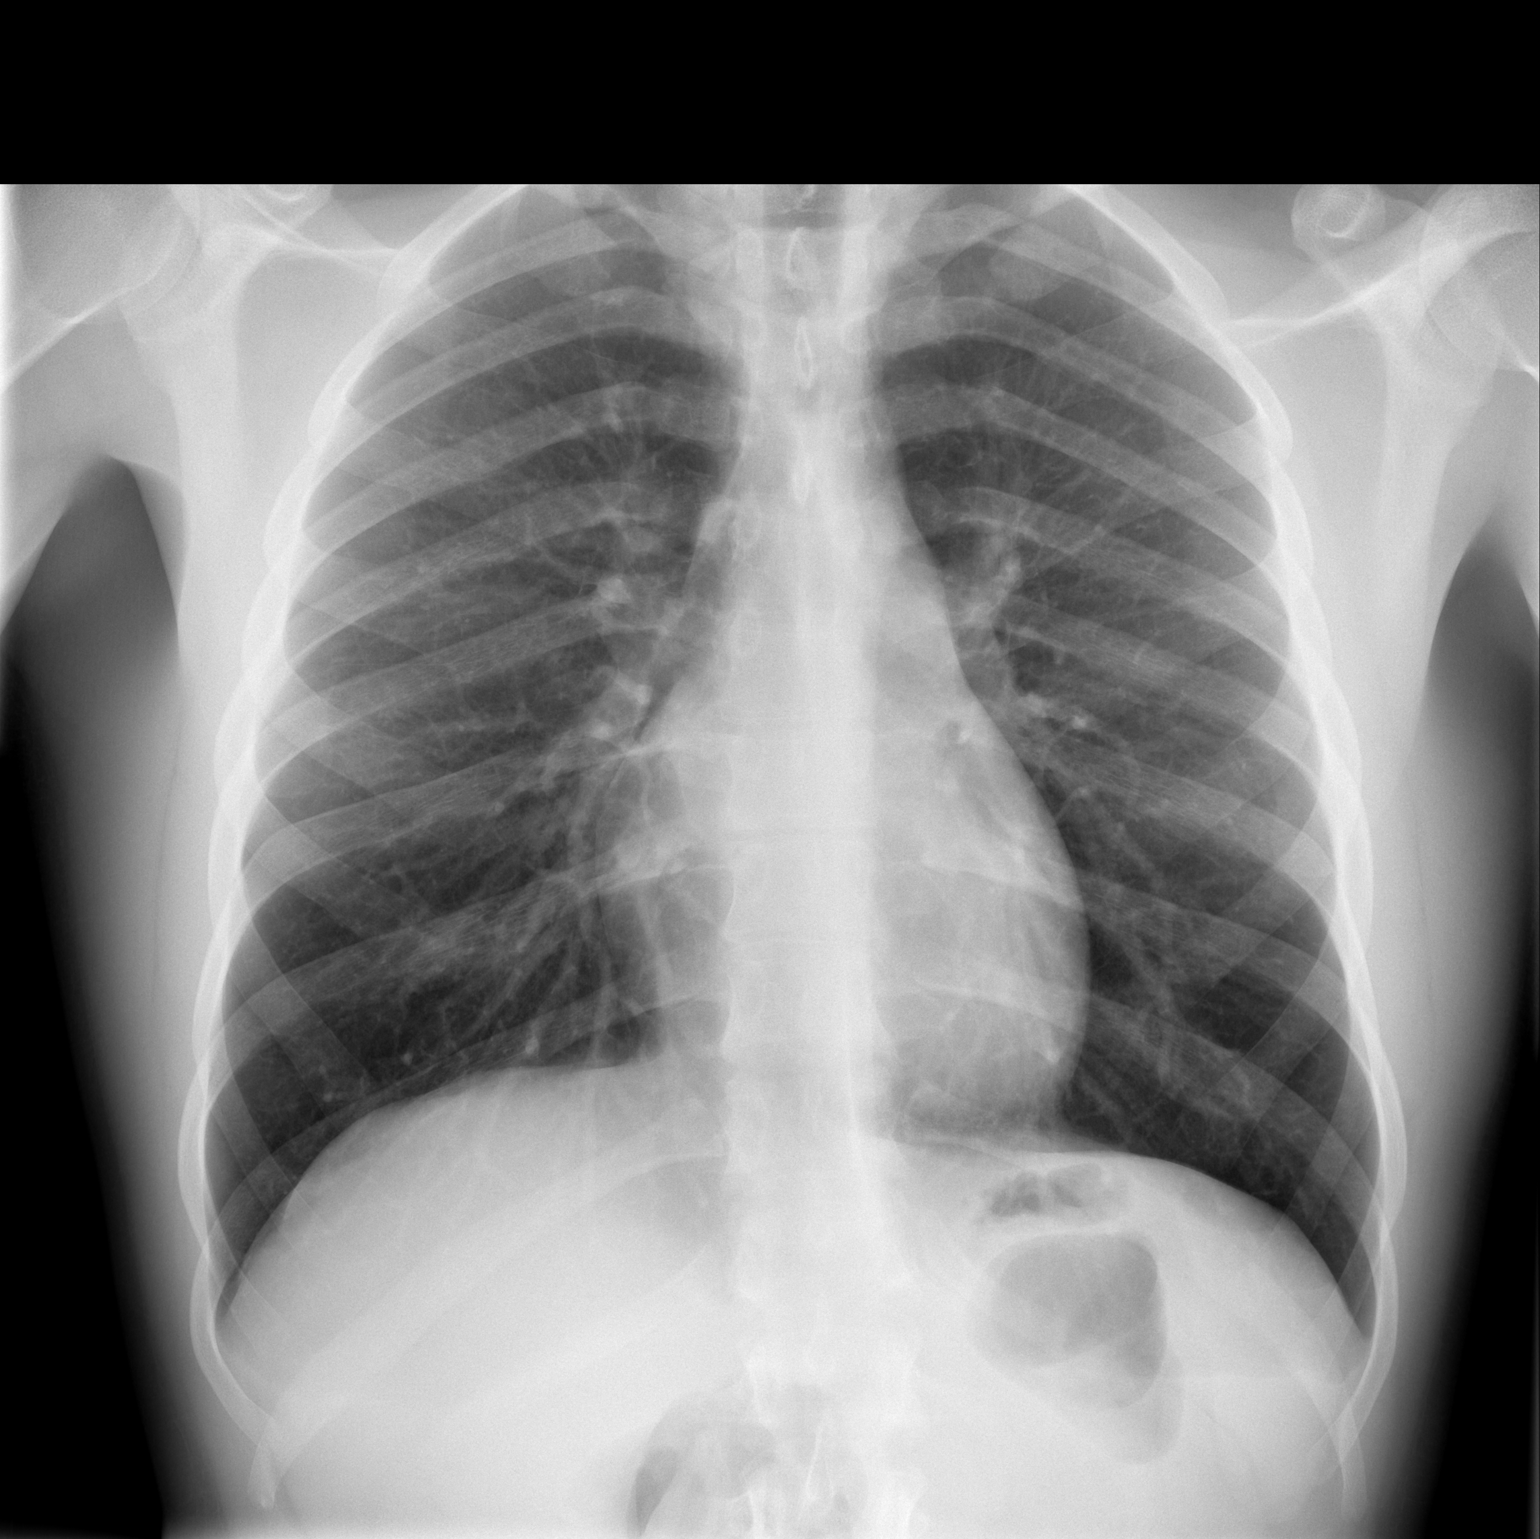

[w chest lat]
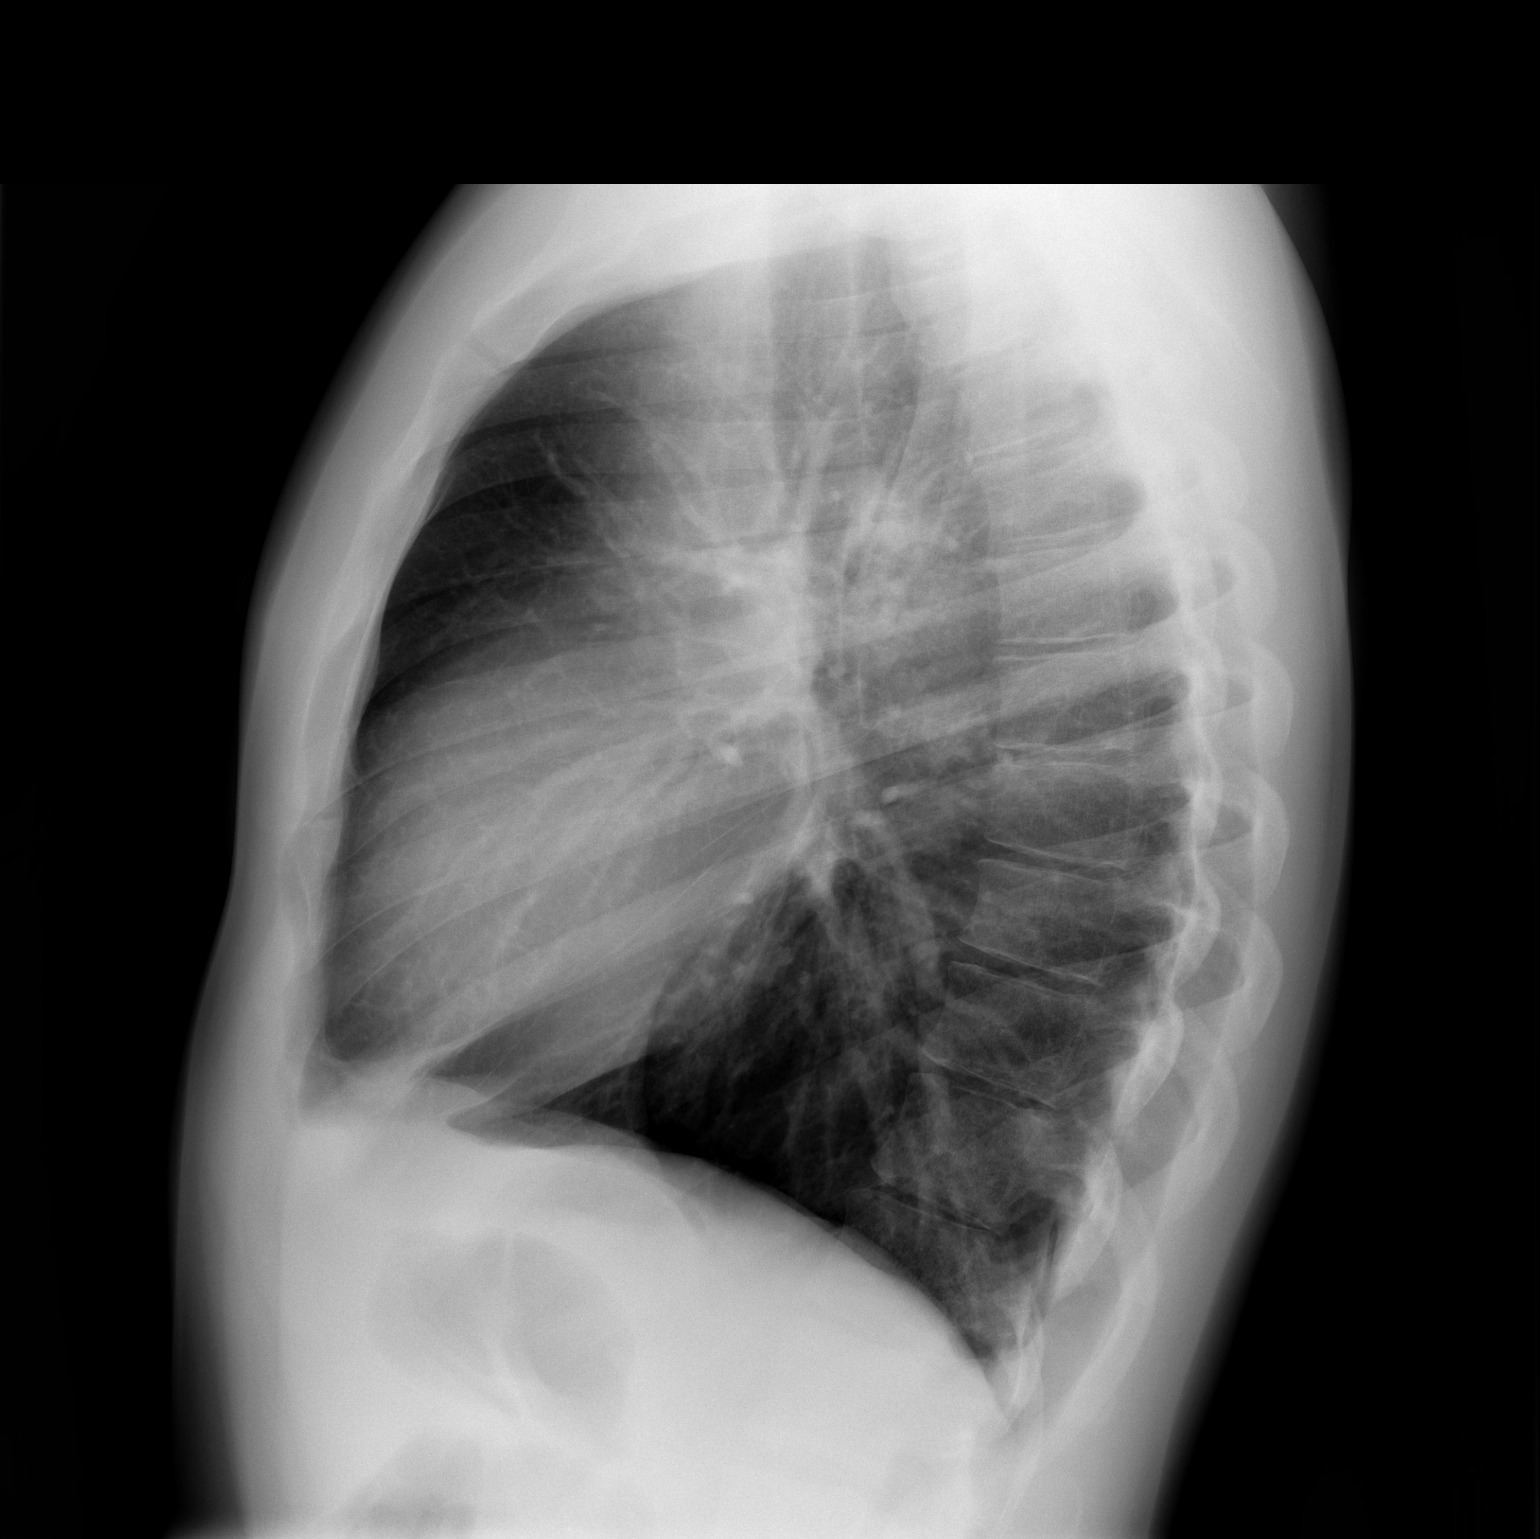

[2 of 2 positions shown; findings below may reference images not displayed]

FINDINGS: Cardiomediastinal silhouette is within normal limits in size and
configuration. Lungs are clear. Lung volumes are normal. No evidence
of pneumonia. No pleural effusion. No pneumothorax seen. Osseous
structures about the chest are unremarkable.
IMPRESSION: Normal chest x-ray. No evidence of pneumonia or pulmonary edema.

## 2020-03-27 DIAGNOSIS — J3089 Other allergic rhinitis: Secondary | ICD-10-CM | POA: Diagnosis not present

## 2020-03-27 DIAGNOSIS — J301 Allergic rhinitis due to pollen: Secondary | ICD-10-CM | POA: Diagnosis not present

## 2020-03-27 DIAGNOSIS — J3081 Allergic rhinitis due to animal (cat) (dog) hair and dander: Secondary | ICD-10-CM | POA: Diagnosis not present

## 2020-04-02 DIAGNOSIS — J3089 Other allergic rhinitis: Secondary | ICD-10-CM | POA: Diagnosis not present

## 2020-04-02 DIAGNOSIS — J3081 Allergic rhinitis due to animal (cat) (dog) hair and dander: Secondary | ICD-10-CM | POA: Diagnosis not present

## 2020-04-02 DIAGNOSIS — J301 Allergic rhinitis due to pollen: Secondary | ICD-10-CM | POA: Diagnosis not present

## 2020-04-09 DIAGNOSIS — J3089 Other allergic rhinitis: Secondary | ICD-10-CM | POA: Diagnosis not present

## 2020-04-09 DIAGNOSIS — J3081 Allergic rhinitis due to animal (cat) (dog) hair and dander: Secondary | ICD-10-CM | POA: Diagnosis not present

## 2020-04-09 DIAGNOSIS — J4540 Moderate persistent asthma, uncomplicated: Secondary | ICD-10-CM | POA: Diagnosis not present

## 2020-04-09 DIAGNOSIS — J301 Allergic rhinitis due to pollen: Secondary | ICD-10-CM | POA: Diagnosis not present

## 2020-04-17 DIAGNOSIS — J301 Allergic rhinitis due to pollen: Secondary | ICD-10-CM | POA: Diagnosis not present

## 2020-04-17 DIAGNOSIS — J3089 Other allergic rhinitis: Secondary | ICD-10-CM | POA: Diagnosis not present

## 2020-04-17 DIAGNOSIS — J3081 Allergic rhinitis due to animal (cat) (dog) hair and dander: Secondary | ICD-10-CM | POA: Diagnosis not present

## 2020-04-20 DIAGNOSIS — J3081 Allergic rhinitis due to animal (cat) (dog) hair and dander: Secondary | ICD-10-CM | POA: Diagnosis not present

## 2020-04-20 DIAGNOSIS — J301 Allergic rhinitis due to pollen: Secondary | ICD-10-CM | POA: Diagnosis not present

## 2020-05-09 DIAGNOSIS — J3089 Other allergic rhinitis: Secondary | ICD-10-CM | POA: Diagnosis not present

## 2020-05-09 DIAGNOSIS — J301 Allergic rhinitis due to pollen: Secondary | ICD-10-CM | POA: Diagnosis not present

## 2020-05-09 DIAGNOSIS — J3081 Allergic rhinitis due to animal (cat) (dog) hair and dander: Secondary | ICD-10-CM | POA: Diagnosis not present

## 2020-05-11 DIAGNOSIS — J3089 Other allergic rhinitis: Secondary | ICD-10-CM | POA: Diagnosis not present

## 2020-05-15 DIAGNOSIS — J3089 Other allergic rhinitis: Secondary | ICD-10-CM | POA: Diagnosis not present

## 2020-05-15 DIAGNOSIS — J301 Allergic rhinitis due to pollen: Secondary | ICD-10-CM | POA: Diagnosis not present

## 2020-05-15 DIAGNOSIS — J3081 Allergic rhinitis due to animal (cat) (dog) hair and dander: Secondary | ICD-10-CM | POA: Diagnosis not present

## 2020-05-17 DIAGNOSIS — J3089 Other allergic rhinitis: Secondary | ICD-10-CM | POA: Diagnosis not present

## 2020-05-22 DIAGNOSIS — J3081 Allergic rhinitis due to animal (cat) (dog) hair and dander: Secondary | ICD-10-CM | POA: Diagnosis not present

## 2020-05-22 DIAGNOSIS — J301 Allergic rhinitis due to pollen: Secondary | ICD-10-CM | POA: Diagnosis not present

## 2020-05-22 DIAGNOSIS — J3089 Other allergic rhinitis: Secondary | ICD-10-CM | POA: Diagnosis not present

## 2020-06-07 DIAGNOSIS — J301 Allergic rhinitis due to pollen: Secondary | ICD-10-CM | POA: Diagnosis not present

## 2020-06-07 DIAGNOSIS — J3089 Other allergic rhinitis: Secondary | ICD-10-CM | POA: Diagnosis not present

## 2020-06-07 DIAGNOSIS — J3081 Allergic rhinitis due to animal (cat) (dog) hair and dander: Secondary | ICD-10-CM | POA: Diagnosis not present

## 2020-06-14 DIAGNOSIS — J3081 Allergic rhinitis due to animal (cat) (dog) hair and dander: Secondary | ICD-10-CM | POA: Diagnosis not present

## 2020-06-14 DIAGNOSIS — J3089 Other allergic rhinitis: Secondary | ICD-10-CM | POA: Diagnosis not present

## 2020-06-14 DIAGNOSIS — J301 Allergic rhinitis due to pollen: Secondary | ICD-10-CM | POA: Diagnosis not present

## 2020-06-28 DIAGNOSIS — J3081 Allergic rhinitis due to animal (cat) (dog) hair and dander: Secondary | ICD-10-CM | POA: Diagnosis not present

## 2020-06-28 DIAGNOSIS — J3089 Other allergic rhinitis: Secondary | ICD-10-CM | POA: Diagnosis not present

## 2020-06-28 DIAGNOSIS — J301 Allergic rhinitis due to pollen: Secondary | ICD-10-CM | POA: Diagnosis not present

## 2020-07-03 DIAGNOSIS — J3089 Other allergic rhinitis: Secondary | ICD-10-CM | POA: Diagnosis not present

## 2020-07-03 DIAGNOSIS — J301 Allergic rhinitis due to pollen: Secondary | ICD-10-CM | POA: Diagnosis not present

## 2020-07-03 DIAGNOSIS — J3081 Allergic rhinitis due to animal (cat) (dog) hair and dander: Secondary | ICD-10-CM | POA: Diagnosis not present

## 2020-07-12 DIAGNOSIS — J301 Allergic rhinitis due to pollen: Secondary | ICD-10-CM | POA: Diagnosis not present

## 2020-07-12 DIAGNOSIS — J3089 Other allergic rhinitis: Secondary | ICD-10-CM | POA: Diagnosis not present

## 2020-07-12 DIAGNOSIS — J3081 Allergic rhinitis due to animal (cat) (dog) hair and dander: Secondary | ICD-10-CM | POA: Diagnosis not present

## 2020-08-28 DIAGNOSIS — J301 Allergic rhinitis due to pollen: Secondary | ICD-10-CM | POA: Diagnosis not present

## 2020-08-28 DIAGNOSIS — J3089 Other allergic rhinitis: Secondary | ICD-10-CM | POA: Diagnosis not present

## 2020-08-28 DIAGNOSIS — J3081 Allergic rhinitis due to animal (cat) (dog) hair and dander: Secondary | ICD-10-CM | POA: Diagnosis not present

## 2020-09-06 DIAGNOSIS — J3089 Other allergic rhinitis: Secondary | ICD-10-CM | POA: Diagnosis not present

## 2020-09-06 DIAGNOSIS — J301 Allergic rhinitis due to pollen: Secondary | ICD-10-CM | POA: Diagnosis not present

## 2020-09-06 DIAGNOSIS — J3081 Allergic rhinitis due to animal (cat) (dog) hair and dander: Secondary | ICD-10-CM | POA: Diagnosis not present

## 2020-09-25 DIAGNOSIS — J3089 Other allergic rhinitis: Secondary | ICD-10-CM | POA: Diagnosis not present

## 2020-09-25 DIAGNOSIS — J301 Allergic rhinitis due to pollen: Secondary | ICD-10-CM | POA: Diagnosis not present

## 2020-09-25 DIAGNOSIS — J3081 Allergic rhinitis due to animal (cat) (dog) hair and dander: Secondary | ICD-10-CM | POA: Diagnosis not present

## 2020-10-02 DIAGNOSIS — J3089 Other allergic rhinitis: Secondary | ICD-10-CM | POA: Diagnosis not present

## 2020-10-02 DIAGNOSIS — J301 Allergic rhinitis due to pollen: Secondary | ICD-10-CM | POA: Diagnosis not present

## 2020-10-02 DIAGNOSIS — J3081 Allergic rhinitis due to animal (cat) (dog) hair and dander: Secondary | ICD-10-CM | POA: Diagnosis not present

## 2020-10-09 DIAGNOSIS — J3081 Allergic rhinitis due to animal (cat) (dog) hair and dander: Secondary | ICD-10-CM | POA: Diagnosis not present

## 2020-10-09 DIAGNOSIS — J3089 Other allergic rhinitis: Secondary | ICD-10-CM | POA: Diagnosis not present

## 2020-10-09 DIAGNOSIS — J301 Allergic rhinitis due to pollen: Secondary | ICD-10-CM | POA: Diagnosis not present

## 2020-10-19 DIAGNOSIS — J3089 Other allergic rhinitis: Secondary | ICD-10-CM | POA: Diagnosis not present

## 2020-10-19 DIAGNOSIS — J3081 Allergic rhinitis due to animal (cat) (dog) hair and dander: Secondary | ICD-10-CM | POA: Diagnosis not present

## 2020-10-19 DIAGNOSIS — J301 Allergic rhinitis due to pollen: Secondary | ICD-10-CM | POA: Diagnosis not present

## 2020-11-01 DIAGNOSIS — J3081 Allergic rhinitis due to animal (cat) (dog) hair and dander: Secondary | ICD-10-CM | POA: Diagnosis not present

## 2020-11-01 DIAGNOSIS — J3089 Other allergic rhinitis: Secondary | ICD-10-CM | POA: Diagnosis not present

## 2020-11-01 DIAGNOSIS — J301 Allergic rhinitis due to pollen: Secondary | ICD-10-CM | POA: Diagnosis not present

## 2020-11-07 DIAGNOSIS — J4540 Moderate persistent asthma, uncomplicated: Secondary | ICD-10-CM | POA: Diagnosis not present

## 2020-11-07 DIAGNOSIS — J3089 Other allergic rhinitis: Secondary | ICD-10-CM | POA: Diagnosis not present

## 2020-11-07 DIAGNOSIS — J301 Allergic rhinitis due to pollen: Secondary | ICD-10-CM | POA: Diagnosis not present

## 2020-11-07 DIAGNOSIS — J3081 Allergic rhinitis due to animal (cat) (dog) hair and dander: Secondary | ICD-10-CM | POA: Diagnosis not present

## 2020-11-09 DIAGNOSIS — Z20828 Contact with and (suspected) exposure to other viral communicable diseases: Secondary | ICD-10-CM | POA: Diagnosis not present

## 2020-11-09 DIAGNOSIS — Z20822 Contact with and (suspected) exposure to covid-19: Secondary | ICD-10-CM | POA: Diagnosis not present

## 2020-11-12 DIAGNOSIS — J301 Allergic rhinitis due to pollen: Secondary | ICD-10-CM | POA: Diagnosis not present

## 2020-11-12 DIAGNOSIS — J3081 Allergic rhinitis due to animal (cat) (dog) hair and dander: Secondary | ICD-10-CM | POA: Diagnosis not present

## 2020-11-12 DIAGNOSIS — J3089 Other allergic rhinitis: Secondary | ICD-10-CM | POA: Diagnosis not present

## 2020-11-28 DIAGNOSIS — J3081 Allergic rhinitis due to animal (cat) (dog) hair and dander: Secondary | ICD-10-CM | POA: Diagnosis not present

## 2020-11-28 DIAGNOSIS — J3089 Other allergic rhinitis: Secondary | ICD-10-CM | POA: Diagnosis not present

## 2020-11-28 DIAGNOSIS — J301 Allergic rhinitis due to pollen: Secondary | ICD-10-CM | POA: Diagnosis not present

## 2020-12-04 DIAGNOSIS — J3081 Allergic rhinitis due to animal (cat) (dog) hair and dander: Secondary | ICD-10-CM | POA: Diagnosis not present

## 2020-12-04 DIAGNOSIS — J3089 Other allergic rhinitis: Secondary | ICD-10-CM | POA: Diagnosis not present

## 2020-12-04 DIAGNOSIS — J301 Allergic rhinitis due to pollen: Secondary | ICD-10-CM | POA: Diagnosis not present

## 2020-12-06 DIAGNOSIS — J3081 Allergic rhinitis due to animal (cat) (dog) hair and dander: Secondary | ICD-10-CM | POA: Diagnosis not present

## 2020-12-06 DIAGNOSIS — J301 Allergic rhinitis due to pollen: Secondary | ICD-10-CM | POA: Diagnosis not present

## 2020-12-06 DIAGNOSIS — J3089 Other allergic rhinitis: Secondary | ICD-10-CM | POA: Diagnosis not present

## 2020-12-14 DIAGNOSIS — J3081 Allergic rhinitis due to animal (cat) (dog) hair and dander: Secondary | ICD-10-CM | POA: Diagnosis not present

## 2020-12-14 DIAGNOSIS — J301 Allergic rhinitis due to pollen: Secondary | ICD-10-CM | POA: Diagnosis not present

## 2020-12-14 DIAGNOSIS — J3089 Other allergic rhinitis: Secondary | ICD-10-CM | POA: Diagnosis not present

## 2020-12-25 DIAGNOSIS — J3089 Other allergic rhinitis: Secondary | ICD-10-CM | POA: Diagnosis not present

## 2020-12-25 DIAGNOSIS — J3081 Allergic rhinitis due to animal (cat) (dog) hair and dander: Secondary | ICD-10-CM | POA: Diagnosis not present

## 2020-12-25 DIAGNOSIS — J301 Allergic rhinitis due to pollen: Secondary | ICD-10-CM | POA: Diagnosis not present

## 2020-12-27 DIAGNOSIS — J3081 Allergic rhinitis due to animal (cat) (dog) hair and dander: Secondary | ICD-10-CM | POA: Diagnosis not present

## 2020-12-27 DIAGNOSIS — J301 Allergic rhinitis due to pollen: Secondary | ICD-10-CM | POA: Diagnosis not present

## 2020-12-28 DIAGNOSIS — J3089 Other allergic rhinitis: Secondary | ICD-10-CM | POA: Diagnosis not present

## 2021-01-02 DIAGNOSIS — J301 Allergic rhinitis due to pollen: Secondary | ICD-10-CM | POA: Diagnosis not present

## 2021-01-02 DIAGNOSIS — J3089 Other allergic rhinitis: Secondary | ICD-10-CM | POA: Diagnosis not present

## 2021-01-02 DIAGNOSIS — J3081 Allergic rhinitis due to animal (cat) (dog) hair and dander: Secondary | ICD-10-CM | POA: Diagnosis not present

## 2021-01-10 DIAGNOSIS — J3081 Allergic rhinitis due to animal (cat) (dog) hair and dander: Secondary | ICD-10-CM | POA: Diagnosis not present

## 2021-01-10 DIAGNOSIS — J301 Allergic rhinitis due to pollen: Secondary | ICD-10-CM | POA: Diagnosis not present

## 2021-01-10 DIAGNOSIS — J3089 Other allergic rhinitis: Secondary | ICD-10-CM | POA: Diagnosis not present

## 2021-01-15 DIAGNOSIS — J301 Allergic rhinitis due to pollen: Secondary | ICD-10-CM | POA: Diagnosis not present

## 2021-01-15 DIAGNOSIS — J3089 Other allergic rhinitis: Secondary | ICD-10-CM | POA: Diagnosis not present

## 2021-01-15 DIAGNOSIS — J3081 Allergic rhinitis due to animal (cat) (dog) hair and dander: Secondary | ICD-10-CM | POA: Diagnosis not present

## 2021-01-22 DIAGNOSIS — J301 Allergic rhinitis due to pollen: Secondary | ICD-10-CM | POA: Diagnosis not present

## 2021-01-22 DIAGNOSIS — J3089 Other allergic rhinitis: Secondary | ICD-10-CM | POA: Diagnosis not present

## 2021-01-22 DIAGNOSIS — J3081 Allergic rhinitis due to animal (cat) (dog) hair and dander: Secondary | ICD-10-CM | POA: Diagnosis not present

## 2021-01-24 DIAGNOSIS — J301 Allergic rhinitis due to pollen: Secondary | ICD-10-CM | POA: Diagnosis not present

## 2021-01-24 DIAGNOSIS — J3089 Other allergic rhinitis: Secondary | ICD-10-CM | POA: Diagnosis not present

## 2021-01-24 DIAGNOSIS — J3081 Allergic rhinitis due to animal (cat) (dog) hair and dander: Secondary | ICD-10-CM | POA: Diagnosis not present

## 2021-01-31 DIAGNOSIS — J3081 Allergic rhinitis due to animal (cat) (dog) hair and dander: Secondary | ICD-10-CM | POA: Diagnosis not present

## 2021-01-31 DIAGNOSIS — J3089 Other allergic rhinitis: Secondary | ICD-10-CM | POA: Diagnosis not present

## 2021-01-31 DIAGNOSIS — J301 Allergic rhinitis due to pollen: Secondary | ICD-10-CM | POA: Diagnosis not present

## 2021-02-11 DIAGNOSIS — J3081 Allergic rhinitis due to animal (cat) (dog) hair and dander: Secondary | ICD-10-CM | POA: Diagnosis not present

## 2021-02-11 DIAGNOSIS — J3089 Other allergic rhinitis: Secondary | ICD-10-CM | POA: Diagnosis not present

## 2021-02-11 DIAGNOSIS — J301 Allergic rhinitis due to pollen: Secondary | ICD-10-CM | POA: Diagnosis not present

## 2021-02-15 DIAGNOSIS — J3089 Other allergic rhinitis: Secondary | ICD-10-CM | POA: Diagnosis not present

## 2021-02-15 DIAGNOSIS — J3081 Allergic rhinitis due to animal (cat) (dog) hair and dander: Secondary | ICD-10-CM | POA: Diagnosis not present

## 2021-02-15 DIAGNOSIS — J301 Allergic rhinitis due to pollen: Secondary | ICD-10-CM | POA: Diagnosis not present

## 2021-02-20 DIAGNOSIS — J3089 Other allergic rhinitis: Secondary | ICD-10-CM | POA: Diagnosis not present

## 2021-02-20 DIAGNOSIS — J301 Allergic rhinitis due to pollen: Secondary | ICD-10-CM | POA: Diagnosis not present

## 2021-02-20 DIAGNOSIS — J3081 Allergic rhinitis due to animal (cat) (dog) hair and dander: Secondary | ICD-10-CM | POA: Diagnosis not present

## 2021-02-26 DIAGNOSIS — J301 Allergic rhinitis due to pollen: Secondary | ICD-10-CM | POA: Diagnosis not present

## 2021-02-26 DIAGNOSIS — J3081 Allergic rhinitis due to animal (cat) (dog) hair and dander: Secondary | ICD-10-CM | POA: Diagnosis not present

## 2021-02-26 DIAGNOSIS — J3089 Other allergic rhinitis: Secondary | ICD-10-CM | POA: Diagnosis not present

## 2021-02-28 DIAGNOSIS — J3081 Allergic rhinitis due to animal (cat) (dog) hair and dander: Secondary | ICD-10-CM | POA: Diagnosis not present

## 2021-02-28 DIAGNOSIS — J301 Allergic rhinitis due to pollen: Secondary | ICD-10-CM | POA: Diagnosis not present

## 2021-02-28 DIAGNOSIS — J3089 Other allergic rhinitis: Secondary | ICD-10-CM | POA: Diagnosis not present

## 2021-03-05 DIAGNOSIS — J301 Allergic rhinitis due to pollen: Secondary | ICD-10-CM | POA: Diagnosis not present

## 2021-03-05 DIAGNOSIS — J3081 Allergic rhinitis due to animal (cat) (dog) hair and dander: Secondary | ICD-10-CM | POA: Diagnosis not present

## 2021-03-05 DIAGNOSIS — J3089 Other allergic rhinitis: Secondary | ICD-10-CM | POA: Diagnosis not present

## 2021-03-07 DIAGNOSIS — J3089 Other allergic rhinitis: Secondary | ICD-10-CM | POA: Diagnosis not present

## 2021-03-07 DIAGNOSIS — J3081 Allergic rhinitis due to animal (cat) (dog) hair and dander: Secondary | ICD-10-CM | POA: Diagnosis not present

## 2021-03-07 DIAGNOSIS — J301 Allergic rhinitis due to pollen: Secondary | ICD-10-CM | POA: Diagnosis not present

## 2021-03-15 DIAGNOSIS — J3089 Other allergic rhinitis: Secondary | ICD-10-CM | POA: Diagnosis not present

## 2021-03-15 DIAGNOSIS — Z13220 Encounter for screening for lipoid disorders: Secondary | ICD-10-CM | POA: Diagnosis not present

## 2021-03-15 DIAGNOSIS — Z23 Encounter for immunization: Secondary | ICD-10-CM | POA: Diagnosis not present

## 2021-03-15 DIAGNOSIS — J3081 Allergic rhinitis due to animal (cat) (dog) hair and dander: Secondary | ICD-10-CM | POA: Diagnosis not present

## 2021-03-15 DIAGNOSIS — K2100 Gastro-esophageal reflux disease with esophagitis, without bleeding: Secondary | ICD-10-CM | POA: Diagnosis not present

## 2021-03-15 DIAGNOSIS — Z0000 Encounter for general adult medical examination without abnormal findings: Secondary | ICD-10-CM | POA: Diagnosis not present

## 2021-03-15 DIAGNOSIS — J301 Allergic rhinitis due to pollen: Secondary | ICD-10-CM | POA: Diagnosis not present

## 2021-03-25 DIAGNOSIS — R109 Unspecified abdominal pain: Secondary | ICD-10-CM | POA: Diagnosis not present

## 2021-03-25 DIAGNOSIS — J4541 Moderate persistent asthma with (acute) exacerbation: Secondary | ICD-10-CM | POA: Diagnosis not present

## 2021-03-25 DIAGNOSIS — R8290 Unspecified abnormal findings in urine: Secondary | ICD-10-CM | POA: Diagnosis not present

## 2021-04-04 DIAGNOSIS — J301 Allergic rhinitis due to pollen: Secondary | ICD-10-CM | POA: Diagnosis not present

## 2021-04-04 DIAGNOSIS — J3089 Other allergic rhinitis: Secondary | ICD-10-CM | POA: Diagnosis not present

## 2021-04-04 DIAGNOSIS — J3081 Allergic rhinitis due to animal (cat) (dog) hair and dander: Secondary | ICD-10-CM | POA: Diagnosis not present

## 2021-04-11 DIAGNOSIS — J3089 Other allergic rhinitis: Secondary | ICD-10-CM | POA: Diagnosis not present

## 2021-04-11 DIAGNOSIS — J3081 Allergic rhinitis due to animal (cat) (dog) hair and dander: Secondary | ICD-10-CM | POA: Diagnosis not present

## 2021-04-11 DIAGNOSIS — J301 Allergic rhinitis due to pollen: Secondary | ICD-10-CM | POA: Diagnosis not present

## 2021-06-14 DIAGNOSIS — J4540 Moderate persistent asthma, uncomplicated: Secondary | ICD-10-CM | POA: Diagnosis not present

## 2021-06-14 DIAGNOSIS — J3089 Other allergic rhinitis: Secondary | ICD-10-CM | POA: Diagnosis not present

## 2021-06-14 DIAGNOSIS — J301 Allergic rhinitis due to pollen: Secondary | ICD-10-CM | POA: Diagnosis not present

## 2021-06-14 DIAGNOSIS — J3081 Allergic rhinitis due to animal (cat) (dog) hair and dander: Secondary | ICD-10-CM | POA: Diagnosis not present

## 2021-07-08 DIAGNOSIS — H53143 Visual discomfort, bilateral: Secondary | ICD-10-CM | POA: Diagnosis not present

## 2021-07-09 DIAGNOSIS — L2089 Other atopic dermatitis: Secondary | ICD-10-CM | POA: Diagnosis not present

## 2021-10-09 DIAGNOSIS — Z03818 Encounter for observation for suspected exposure to other biological agents ruled out: Secondary | ICD-10-CM | POA: Diagnosis not present

## 2021-10-09 DIAGNOSIS — J029 Acute pharyngitis, unspecified: Secondary | ICD-10-CM | POA: Diagnosis not present

## 2022-02-11 DIAGNOSIS — J4540 Moderate persistent asthma, uncomplicated: Secondary | ICD-10-CM | POA: Diagnosis not present

## 2022-02-11 DIAGNOSIS — J3081 Allergic rhinitis due to animal (cat) (dog) hair and dander: Secondary | ICD-10-CM | POA: Diagnosis not present

## 2022-02-11 DIAGNOSIS — J301 Allergic rhinitis due to pollen: Secondary | ICD-10-CM | POA: Diagnosis not present

## 2022-02-11 DIAGNOSIS — J3089 Other allergic rhinitis: Secondary | ICD-10-CM | POA: Diagnosis not present

## 2022-02-14 DIAGNOSIS — Z23 Encounter for immunization: Secondary | ICD-10-CM | POA: Diagnosis not present

## 2022-02-14 DIAGNOSIS — R6884 Jaw pain: Secondary | ICD-10-CM | POA: Diagnosis not present

## 2022-02-19 DIAGNOSIS — J3081 Allergic rhinitis due to animal (cat) (dog) hair and dander: Secondary | ICD-10-CM | POA: Diagnosis not present

## 2022-02-19 DIAGNOSIS — J301 Allergic rhinitis due to pollen: Secondary | ICD-10-CM | POA: Diagnosis not present

## 2022-02-20 DIAGNOSIS — J3089 Other allergic rhinitis: Secondary | ICD-10-CM | POA: Diagnosis not present

## 2022-03-07 DIAGNOSIS — Z8279 Family history of other congenital malformations, deformations and chromosomal abnormalities: Secondary | ICD-10-CM | POA: Diagnosis not present

## 2022-03-07 DIAGNOSIS — H6123 Impacted cerumen, bilateral: Secondary | ICD-10-CM | POA: Diagnosis not present

## 2022-03-07 DIAGNOSIS — Z8489 Family history of other specified conditions: Secondary | ICD-10-CM | POA: Diagnosis not present

## 2022-03-07 DIAGNOSIS — H903 Sensorineural hearing loss, bilateral: Secondary | ICD-10-CM | POA: Diagnosis not present

## 2022-03-07 DIAGNOSIS — H9193 Unspecified hearing loss, bilateral: Secondary | ICD-10-CM | POA: Diagnosis not present

## 2022-03-07 DIAGNOSIS — Z888 Allergy status to other drugs, medicaments and biological substances status: Secondary | ICD-10-CM | POA: Diagnosis not present

## 2022-11-13 ENCOUNTER — Institutional Professional Consult (permissible substitution): Payer: BC Managed Care – PPO | Admitting: Pulmonary Disease

## 2023-01-04 NOTE — Progress Notes (Unsigned)
Subjective:    Patient ID: Michael Norman, male    DOB: [birth year], 27 y.o.   MRN: [id number]  PCP Michael Dills, MD   HPI  IOV 06/22/2019  Chief Complaint  Patient presents with   Pulmonary Consult    SOB, unable to catch a full breath    Michael Norman  -presents with his mom Michael Norman.  He is a grandson of my other patient Michael Norman, Sr.  Patient is a very good historian.  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  He lives with his parents and brothers.  He is a second of 4 children.  He tells me that approximately since July 2019 he has been unable to get a deep breath otherwise feels like chest tightness.  The initial onset was when he visited the beach house here in [place] and before he went to [place] in August 2019.  He describes this is random episodes lasting from few hours to few days.  But never lasting minutes always lasting hours to days.  He said when he was living in [place] between August 2019 and March 2020 it was more severe.  In both instances work stress or emotional stress and acid reflux symptoms bring this on and when work stress of personal stress is less the symptoms are less.  Nevertheless this do not illuminated.  Tums tablets does help but PPI has not helped.  Sometimes MDI helps partially especially albuterol.  But has been on Qvar and Symbicort and allergy shots and these have not really helped.  There is no wheezing or proximal nocturnal dyspnea orthopnea.  He gets his symptoms mainly when he is at rest.  Working out riding a bicycle does not provoke these.  He has a history of acid reflux since the last 4 to 5 years.  He has a lot of burping and indigestion.  He had an endoscopy in September 2020 by Dr. Matthias Norman that apparently showed inflammation.  He took PPI and even H2 blockade but this has not helped his chest tightness.  He has a long history of  allergies to "all of nature".  He has seen Michael Norman for this and follows up with him.  Few years ago he was on allergy shots and this helped with his allergy symptoms which include runny nose and itchy eyes.  These allergy symptoms are unrelated to his need to take a deep breath.  For the last 3 months he has been back on allergy shots without any help in the presenting complaint for this visit.  He was also on Qvar for many years but in the last 6 months has been on Symbicort again without any relief for the current symptoms.  He does use Nettie pot few times a week.  In 2018 he did have a ruptured appendicitis and had abscess and is status post surgery. \    He did have a follow-up CT of the abdomen in October 2020.  I personally visualized his lung images and the clear.  His chest x-ray from September 2020 is clear  He is also worried about potential TB exposure when he was in [place].  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  He does not have any active symptoms  of tuberculosis at this point.    has a past medical history of Asthma.   reports that he has never smoked. He has never used smokeless tobacco.  OV 01/18/2020  Subjective:  Patient ID: Michael Norman, male , DOB: 09-03-1995 , age 22 y.o. , MRN: [id number] , ADDRESS: [address] [place] [address]   01/18/2020 -   Chief Complaint  Patient presents with   Follow-up    Pt states he is about the same since visit and states he still is becoming SOB with activities and also due to indigestion.     HPI Michael Norman 27 y.o. -presents for follow-up.  He is here to review evaluation.  Overall the same since last visit.  Still has some shortness of breath with activities and also indigestion.   OV 01/05/2023  Subjective:  Patient ID: Michael Norman, male , DOB: 1995/07/07 , age 63 y.o. , MRN:  [id number] , ADDRESS: [address] [place] [address] PCP Michael Dills, MD Patient Care Team: Michael Dills, MD as PCP - General (Internal Medicine)  This Provider for this visit: Treatment Team:  Attending Provider: Martina Sinner, MD    01/05/2023 -   Chief Complaint  Patient presents with   Consult    Not a new pt last seen in office 09/28/2019. Chronic cough.     HPI Michael Norman 26 y.o. -   Dr Michael Norman Reflux Symptom Index (> 13-15 suggestive of LPR cough) 0 -> 5  =  none ->severe problem 01/05/2023   Hoarseness of problem with voice 0  Clearing  Of Throat 3  Excess throat mucus or feeling of post nasal drip 3  Difficulty swallowing food, liquid or tablets 0  Cough after eating or lying down 3  Breathing difficulties or choking episodes 2  Troublesome or annoying cough 3  Sensation of something sticking in throat or lump in throat 3  Heartburn, chest pain, indigestion, or stomach acid coming up 3  TOTAL 20    PFT    Latest Ref Rng & Units 08/11/2019    3:57 PM  PFT Results  FVC-Pre L 6.70   FVC-Predicted Pre % 110   FVC-Post L 6.93   FVC-Predicted Post % 113   Pre FEV1/FVC % % 83   Post FEV1/FCV % % 87   FEV1-Pre L 5.57   FEV1-Predicted Pre % 111   FEV1-Post L 6.01          LAB RESULTS last 96 hours No results found.  LAB RESULTS last 90 days No results found for this or any previous visit (from the past 2160 hour(s)).       has a past medical history of Asthma.   reports that he has never smoked. He has never used smokeless tobacco.  Past Surgical History:  Procedure Laterality Date   WISDOM TOOTH EXTRACTION  Aug 2015    Allergies  Allergen Reactions   Zyrtec [Cetirizine]     Immunization History  Administered Date(s) Administered   Influenza,inj,Quad PF,6+ Mos 03/03/2019   Influenza-Unspecified 04/23/2016    No family history on file.   Current Outpatient Medications:    albuterol (VENTOLIN HFA) 108 (90  Base) MCG/ACT inhaler, , Disp: , Rfl:    loratadine (CLARITIN) 10 MG tablet, Take 10 mg by mouth daily., Disp: , Rfl:    SYMBICORT 160-4.5 MCG/ACT inhaler, , Disp: , Rfl:       Objective:   Vitals:   01/05/23  0905  BP: 130/80  Pulse: 74  SpO2: 98%  Weight: 231 lb 6.4 oz (105 kg)  Height: 6\' 1"  (1.854 m)    Estimated body mass index is 30.53 kg/m as calculated from the following:   Height as of this encounter: 6\' 1"  (1.854 m).   Weight as of this encounter: 231 lb 6.4 oz (105 kg).  @WEIGHTCHANGE @  [employer]   01/05/23 0905  Weight: 231 lb 6.4 oz (105 kg)     Physical Exam   General: No distress. Looks well O2 at rest: no Cane present: no Sitting in wheel chair: no Frail: no Obese: no Neuro: Alert and Oriented x 3. GCS 15. Speech normal Psych: Pleasant Resp:  Barrel Chest - no.  Wheeze - no, Crackles - no, No overt respiratory distress CVS: Normal heart sounds. Murmurs - no Ext: Stigmata of Connective Tissue Disease - no HEENT: Normal upper airway. PEERL +. No post nasal drip        Assessment:       ICD-10-CM   1. Chronic cough  R05.3     2. Chronic throat clearing  R09.89     3. History of indigestion  Z87.19     4. History of seasonal allergies  Z88.9          Plan:     Patient Instructions     ICD-10-CM   1. Chronic cough  R05.3     2. Chronic throat clearing  R09.89     3. History of indigestion  Z87.19     4. History of seasonal allergies  Z88.9       There Is some level of intolerance to carb-laden foods that is contributing to above symptoms Noted off allergy shots due to eczema   Plan  - cxr 01/05/2023 - check cbc with diff 01/05/2023 for blood eosinophil count - continue symbicort but ok to use it as needed as advised by Dr Irena Cords - move towards a lean diet:- plant based or low carb- mediterranean diet and limit alcohol   Followup  -Dec 2024 15 min video or face-face   FOLLOWUP Return in about 4 months (around  05/07/2023) for 15 min visit, with Dr Marchelle Gearing, Face to Face Visit.    SIGNATURE    Dr. Kalman Shan, M.D., F.C.C.P,  Pulmonary and Critical Care Medicine Staff Physician, [facility] Director - Interstitial Lung Disease  Program  Pulmonary Fibrosis [facility] at [facility] [place], [place], [address]  Pager: [phone], If no answer or between  15:00h - 7:00h: call [phone] Telephone: [phone]  9:57 AM 01/05/2023   Moderate Complexity MDM OFFICE  2021 E/M guidelines, first released in 2021, with minor revisions added in 2023 and 2024 Must meet the requirements for 2 out of 3 dimensions to qualify.    Number and complexity of problems addressed Amount and/or complexity of data reviewed Risk of complications and/or morbidity  One or more chronic illness with mild exacerbation, OR progression, OR  side effects of treatment  Two or more stable chronic illnesses  One undiagnosed new problem with uncertain prognosis  One acute illness with systemic symptoms   One Acute complicated injury Must meet the requirements for 1 of 3 of the categories)  Category 1: Tests and documents, historian  Any combination of 3 of the following:  Assessment requiring an independent historian  Review of prior external note(s) from each unique source  Review of results of each unique test  Ordering of each unique test    Category 2: Interpretation of tests   Independent interpretation of a test performed by another physician/other qualified health care professional (not separately reported)  Category 3: Discuss management/tests  Discussion of management or test interpretation with external physician/other qualified health care professional/appropriate source (not separately reported) Moderate risk of morbidity from additional diagnostic testing or treatment Examples only:  Prescription drug management  Decision regarding  minor surgery with identfied patient or procedure risk factors  Decision regarding elective major surgery without identified patient or procedure risk factors  Diagnosis or treatment significantly limited by social determinants of health             HIGh Complexity  OFFICE   2021 E/M guidelines, first released in 2021, with minor revisions added in 2023. Must meet the requirements for 2 out of 3 dimensions to qualify.    Number and complexity of problems addressed Amount and/or complexity of data reviewed Risk of complications and/or morbidity  Severe exacerbation of chronic illness  Acute or chronic illnesses that may pose a threat to life or bodily function, e.g., multiple trauma, acute MI, pulmonary embolus, severe respiratory distress, progressive rheumatoid arthritis, psychiatric illness with potential threat to self or others, peritonitis, acute renal failure, abrupt change in neurological status Must meet the requirements for 2 of 3 of the categories)  Category 1: Tests and documents, historian  Any combination of 3 of the following:  Assessment requiring an independent historian  Review of prior external note(s) from each unique source  Review of results of each unique test  Ordering of each unique test    Category 2: Interpretation of tests    Independent interpretation of a test performed by another physician/other qualified health care professional (not separately reported)  Category 3: Discuss management/tests  Discussion of management or test interpretation with external physician/other qualified health care professional/appropriate source (not separately reported)  HIGH risk of morbidity from additional diagnostic testing or treatment Examples only:  Drug therapy requiring intensive monitoring for toxicity  Decision for elective major surgery with identified pateint or procedure risk factors  Decision regarding hospitalization or escalation of level  of care  Decision for DNR or to de-escalate care   Parenteral controlled  substances            LEGEND - Independent interpretation involves the interpretation of a test for which there is a CPT code, and an interpretation or report is customary. When a review and interpretation of a test is performed and documented by the provider, but not separately reported (billed), then this would represent an independent interpretation. This report does not need to conform to the usual standards of a complete report of the test. This does not include interpretation of tests that do not have formal reports such as a complete blood count with differential and blood cultures. Examples would include reviewing a chest radiograph and documenting in the medical record an interpretation, but not separately reporting (billing) the interpretation of the chest radiograph.   An appropriate source includes professionals who are not health care professionals but may be involved in the management of the patient, such as a Clinical research associate, upper officer, case manager or teacher, and does not include discussion with family or informal caregivers.    - SDOH: SDOH are the conditions in the environments where people are born, live, learn, work, play, worship, and age that affect a wide range of health, functioning, and  quality-of-life outcomes and risks. (e.g., housing, food insecurity, transportation, etc.). SDOH-related Z codes ranging from Z55-Z65 are the ICD-10-CM diagnosis codes used to document SDOH data Z55 - Problems related to education and literacy Z56 - Problems related to employment and unemployment Z57 - Occupational exposure to risk factors Z58 - Problems related to physical environment Z59 - Problems related to housing and economic circumstances [phone] - Problems related to social environment [phone] - Problems related to upbringing [phone] - Other problems related to primary support group, including family  circumstances Z58 - Problems related to certain psychosocial circumstances Z65 - Problems related to other psychosocial circumstances

## 2023-01-04 NOTE — Patient Instructions (Incomplete)
ICD-10-CM   1. Dyspnea on exertion  R06.00   2. Gastroesophageal reflux disease, unspecified whether esophagitis present  K21.9      Agre with expectant approach + life style change  Plan  - meditation   - use app like headspace or Calm. Also look up [employer] on medidation  - daily 10-13min - yoga - do once a week - diet  - use myfitnesspal for caloric limit  - follow low glycemic diet sheet though need to adust pre-workout and post-workout - exercise  - high intensity interval training and weights 2 x/week  - continue symbicort and allergy shots  - do covid vaccine if not done yet  Followup  - 6 months or sooner if needed   - if no improvement then CPST bike test +/- CT

## 2023-01-05 ENCOUNTER — Encounter: Payer: Self-pay | Admitting: Internal Medicine

## 2023-01-05 ENCOUNTER — Ambulatory Visit (INDEPENDENT_AMBULATORY_CARE_PROVIDER_SITE_OTHER): Payer: Managed Care, Other (non HMO) | Admitting: Internal Medicine

## 2023-01-05 ENCOUNTER — Ambulatory Visit (INDEPENDENT_AMBULATORY_CARE_PROVIDER_SITE_OTHER): Payer: Managed Care, Other (non HMO)

## 2023-01-05 VITALS — BP 130/80 | HR 74 | Ht 73.0 in | Wt 231.4 lb

## 2023-01-05 DIAGNOSIS — R053 Chronic cough: Secondary | ICD-10-CM

## 2023-01-05 DIAGNOSIS — Z889 Allergy status to unspecified drugs, medicaments and biological substances status: Secondary | ICD-10-CM | POA: Diagnosis not present

## 2023-01-05 DIAGNOSIS — R0989 Other specified symptoms and signs involving the circulatory and respiratory systems: Secondary | ICD-10-CM | POA: Diagnosis not present

## 2023-01-05 DIAGNOSIS — Z8719 Personal history of other diseases of the digestive system: Secondary | ICD-10-CM | POA: Diagnosis not present

## 2023-01-05 LAB — CBC WITH DIFFERENTIAL/PLATELET
Basophils Absolute: 0 10*3/uL (ref 0.0–0.1)
Basophils Relative: 0.4 % (ref 0.0–3.0)
Eosinophils Absolute: 0.1 10*3/uL (ref 0.0–0.7)
Eosinophils Relative: 2.4 % (ref 0.0–5.0)
HCT: 39.9 % (ref 39.0–52.0)
Hemoglobin: 13.2 g/dL (ref 13.0–17.0)
Lymphocytes Relative: 42 % (ref 12.0–46.0)
Lymphs Abs: 2.2 10*3/uL (ref 0.7–4.0)
MCHC: 33 g/dL (ref 30.0–36.0)
MCV: 89.7 fl (ref 78.0–100.0)
Monocytes Absolute: 0.3 10*3/uL (ref 0.1–1.0)
Monocytes Relative: 6.5 % (ref 3.0–12.0)
Neutro Abs: 2.6 10*3/uL (ref 1.4–7.7)
Neutrophils Relative %: 48.7 % (ref 43.0–77.0)
Platelets: 239 10*3/uL (ref 150.0–400.0)
RBC: 4.45 Mil/uL (ref 4.22–5.81)
RDW: 12.6 % (ref 11.5–15.5)
WBC: 5.3 10*3/uL (ref 4.0–10.5)
